# Patient Record
Sex: Male | Born: 1935 | Race: White | Hispanic: No | Marital: Married | Smoking: Former smoker
Health system: Southern US, Community
[De-identification: ages and names within clinical notes are randomized; demographics above are authoritative.]

## PROBLEM LIST (undated history)

## (undated) DIAGNOSIS — E785 Hyperlipidemia, unspecified: Secondary | ICD-10-CM

## (undated) DIAGNOSIS — E119 Type 2 diabetes mellitus without complications: Secondary | ICD-10-CM

## (undated) DIAGNOSIS — E1129 Type 2 diabetes mellitus with other diabetic kidney complication: Secondary | ICD-10-CM

## (undated) DIAGNOSIS — K219 Gastro-esophageal reflux disease without esophagitis: Secondary | ICD-10-CM

## (undated) DIAGNOSIS — I10 Essential (primary) hypertension: Secondary | ICD-10-CM

## (undated) DIAGNOSIS — E559 Vitamin D deficiency, unspecified: Secondary | ICD-10-CM

## (undated) HISTORY — DX: Type 2 diabetes mellitus with other diabetic kidney complication: E11.29

## (undated) HISTORY — DX: Gastro-esophageal reflux disease without esophagitis: K21.9

## (undated) HISTORY — PX: CARDIAC CATHETERIZATION: SHX172

## (undated) HISTORY — DX: Type 2 diabetes mellitus without complications: E11.9

## (undated) HISTORY — DX: Hyperlipidemia, unspecified: E78.5

## (undated) HISTORY — DX: Essential (primary) hypertension: I10

## (undated) HISTORY — DX: Vitamin D deficiency, unspecified: E55.9

---
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
## 2001-08-28 HISTORY — PX: CORONARY ARTERY BYPASS GRAFT: SHX141

## 2002-04-02 ENCOUNTER — Inpatient Hospital Stay (HOSPITAL_COMMUNITY): Admission: AD | Admit: 2002-04-02 | Discharge: 2002-04-08 | Payer: Self-pay | Admitting: Cardiology

## 2002-04-03 ENCOUNTER — Encounter: Payer: Self-pay | Admitting: Thoracic Surgery (Cardiothoracic Vascular Surgery)

## 2002-04-04 ENCOUNTER — Encounter: Payer: Self-pay | Admitting: Thoracic Surgery (Cardiothoracic Vascular Surgery)

## 2002-04-05 ENCOUNTER — Encounter: Payer: Self-pay | Admitting: Thoracic Surgery (Cardiothoracic Vascular Surgery)

## 2002-04-06 ENCOUNTER — Encounter: Payer: Self-pay | Admitting: Thoracic Surgery (Cardiothoracic Vascular Surgery)

## 2002-05-05 ENCOUNTER — Encounter (HOSPITAL_COMMUNITY): Admission: RE | Admit: 2002-05-05 | Discharge: 2002-08-03 | Payer: Self-pay | Admitting: Cardiology

## 2003-11-04 ENCOUNTER — Encounter: Admission: RE | Admit: 2003-11-04 | Discharge: 2003-11-04 | Payer: Self-pay | Admitting: Internal Medicine

## 2003-12-01 ENCOUNTER — Encounter: Admission: RE | Admit: 2003-12-01 | Discharge: 2003-12-01 | Payer: Self-pay | Admitting: Internal Medicine

## 2004-12-03 IMAGING — CT CT ABDOMEN WO/W CM
1 series · 15 of 32 positions shown, 19 images · IV contrast (gastro)
Comparison: none

CLINICAL DATA: Left renal contour bulge on abdominal ultrasound for further assessment.  Post appendectomy.  Multiple drug allergies.  Diabetes.  Post CABG.  Contrast code:   V14.8, 429.9.
 ABDOMINAL CT, PRE AND POST CONTRAST ? 12/01/03 
 Following oral contrast and pre and post IV injection of 125 cc Omnipaque 300, Multidetector spiral axial images were obtained through the abdomen and comparison made with abdominal ultrasound of 11/04/03.  Finding of left renal contour bulge at the mid pole corresponds to anatomic variant, so-called ?dromedary hump?.  No focal renal cystic nor solid mass is seen in this region.  Again seen is slight diffuse fatty infiltration of the liver.  Moderate atheromatous vascular calcification of the abdominal aorta is seen with maximal dimension measuring 2.7 cm (image 81).  Base of lungs are clear.  Incidental bilateral mid right and 6 mm lower pole left cortical renal cystic foci are seen.  Remaining abdominal organs are unremarkable with no inflammation or adenopathy. 
 IMPRESSION
 Since abdominal ultrasound of 11/04/03:
 1.  Left renal contour bulge corresponds to anatomic variant dromedary hump/fetal lobulation.
 2.  Stable slight diffuse fatty infiltration of liver.
 3.  Moderate atheromatous vascular calcification of abdominal aorta and iliac branches. 
 4.  Otherwise no significant abnormality.

[Series 2: routine abdomen · axial · 0.86mm/px · z∈[-294,-24]mm · 15 of 135 slices shown, 19 images]
[im 9/135  soft-tissue]
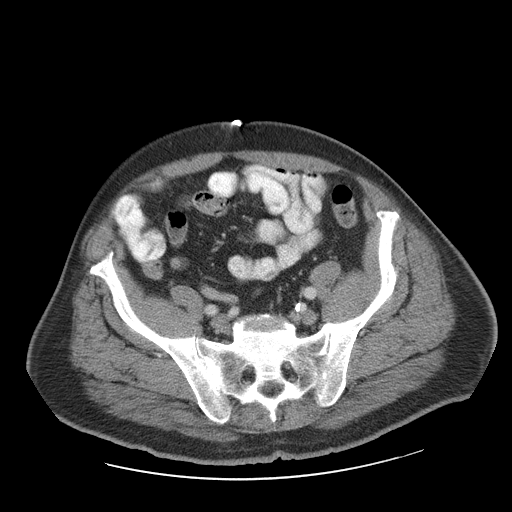
[im 9/135  bone]
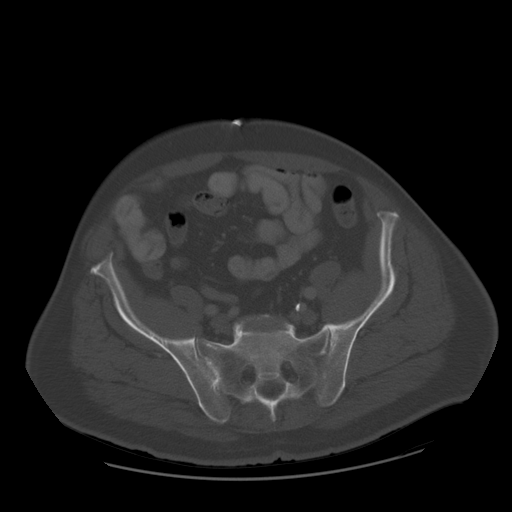
[im 18/135  soft-tissue]
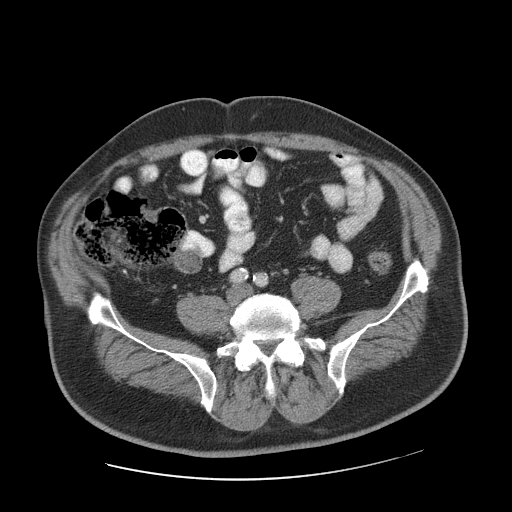
[im 26/135  soft-tissue]
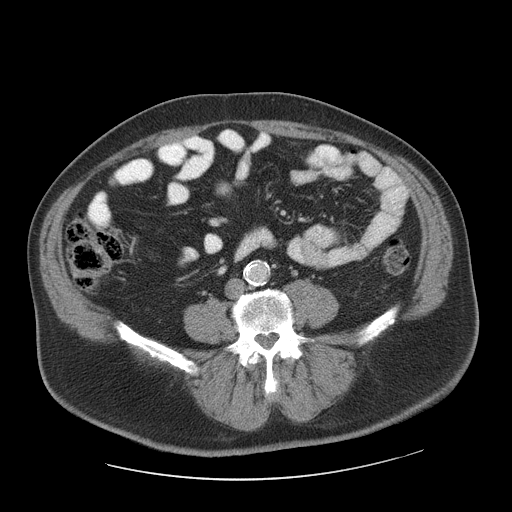
[im 39/135  soft-tissue]
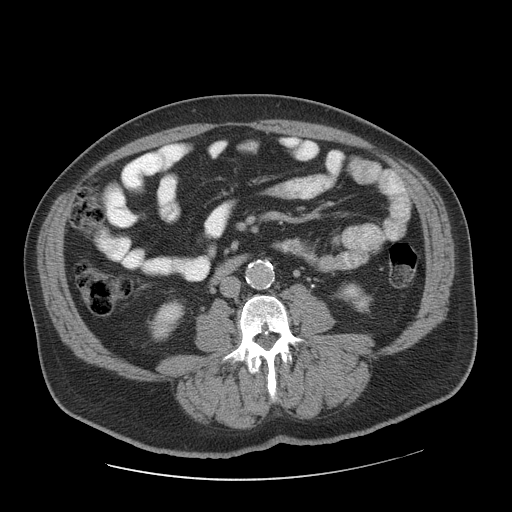
[im 48/135  soft-tissue]
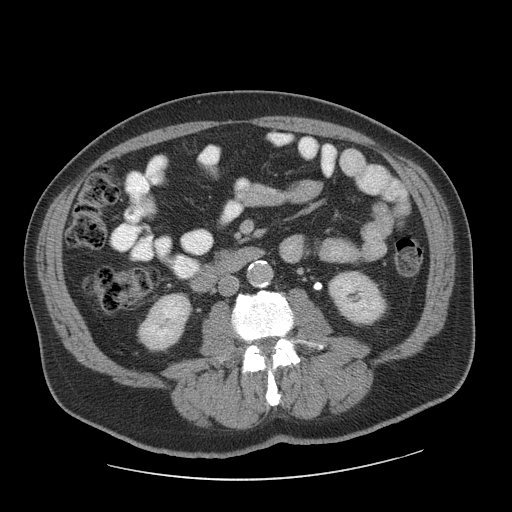
[im 57/135  soft-tissue]
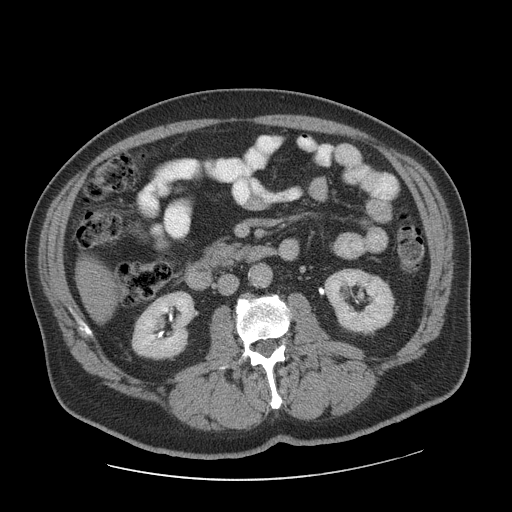
[im 70/135  soft-tissue]
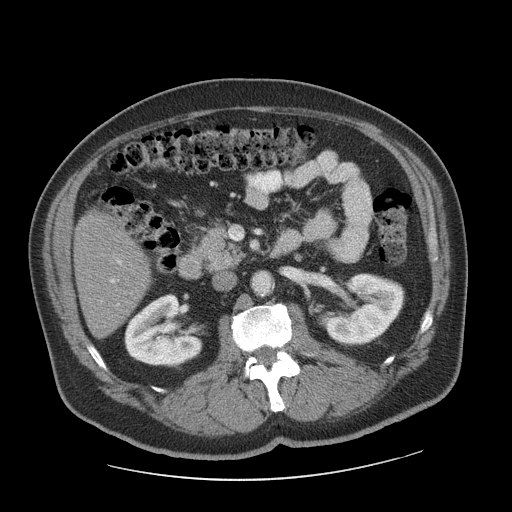
[im 78/135  soft-tissue]
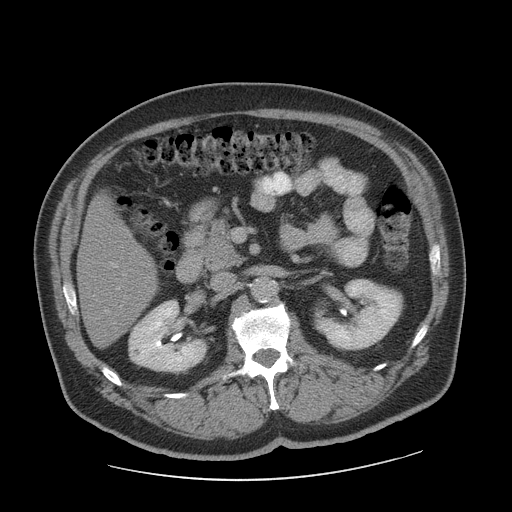
[im 87/135  soft-tissue]
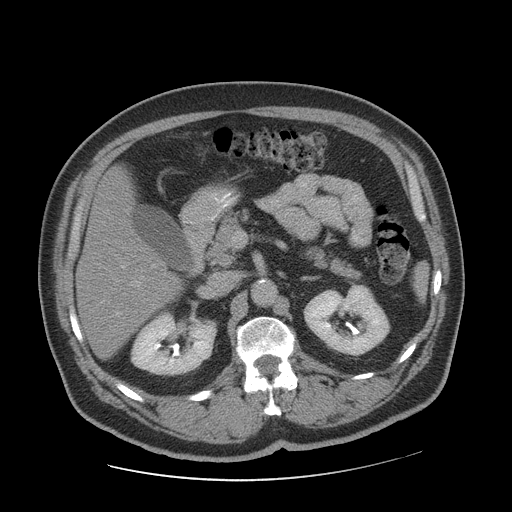
[im 87/135  bone]
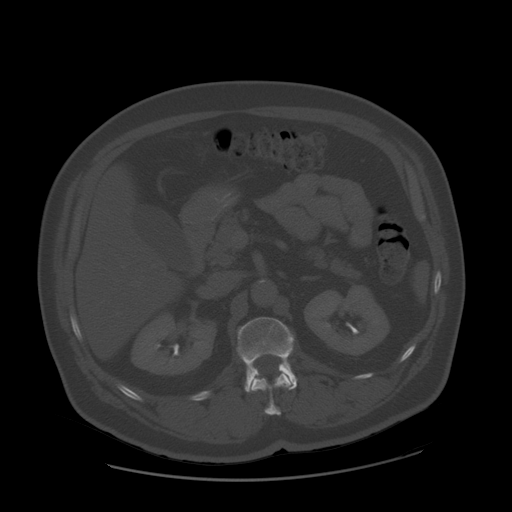
[im 96/135  soft-tissue]
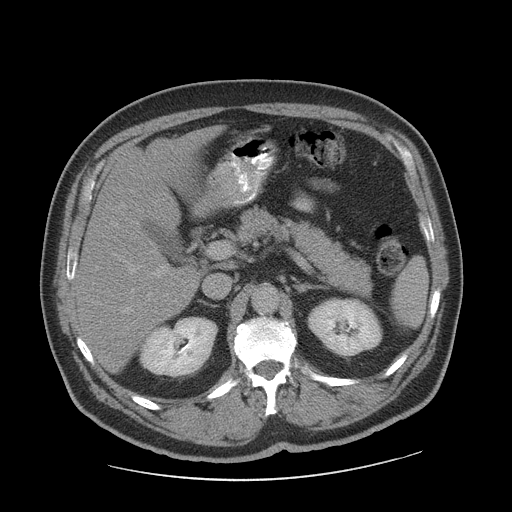
[im 109/135  soft-tissue]
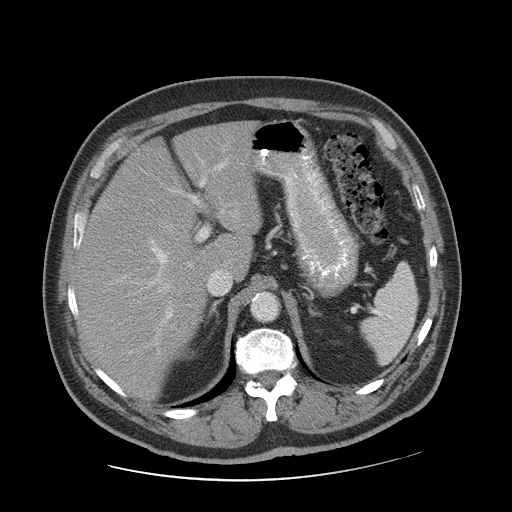
[im 117/135  soft-tissue]
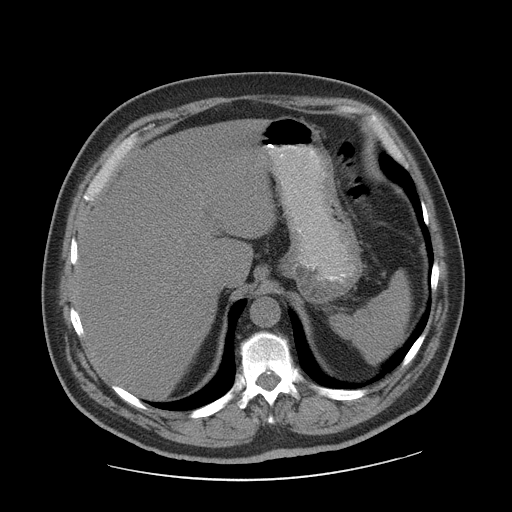
[im 117/135  lung]
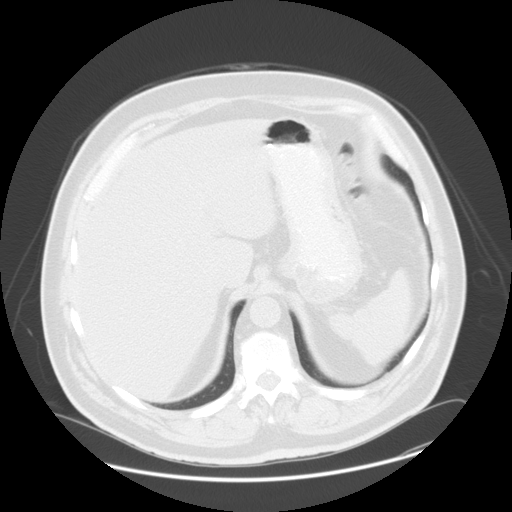
[im 122/135  lung]
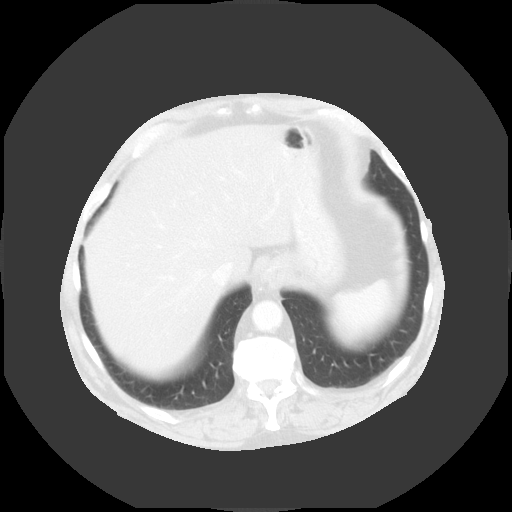
[im 126/135  soft-tissue]
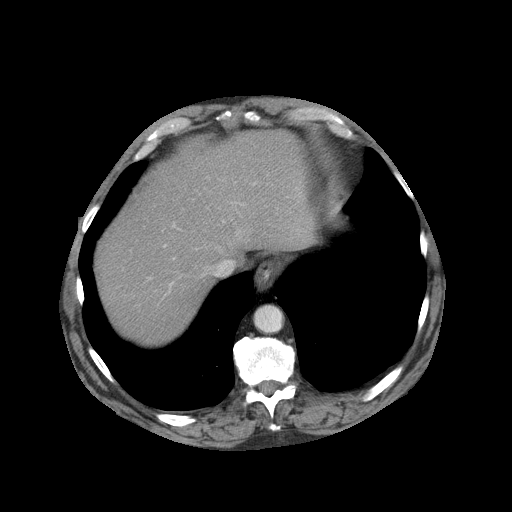
[im 126/135  lung]
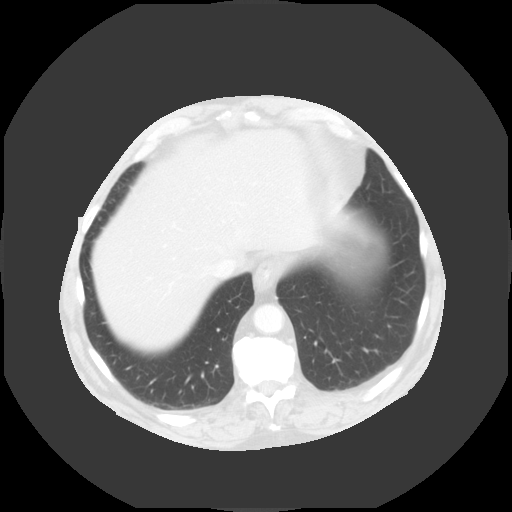
[im 130/135  lung]
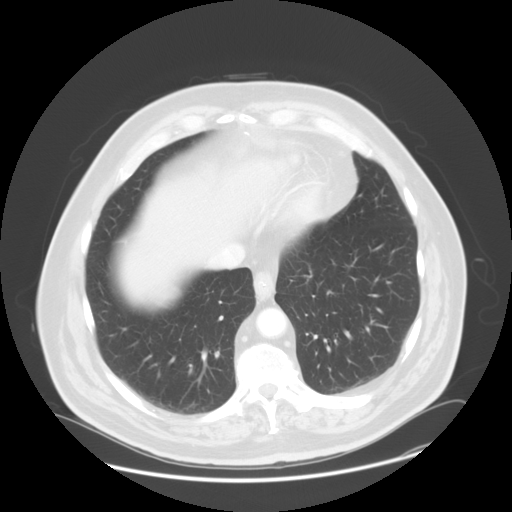

[15 of 32 positions shown; findings below may reference images not displayed]

## 2005-08-24 ENCOUNTER — Encounter: Admission: RE | Admit: 2005-08-24 | Discharge: 2005-08-27 | Payer: Self-pay | Admitting: Internal Medicine

## 2005-09-11 ENCOUNTER — Encounter: Admission: RE | Admit: 2005-09-11 | Discharge: 2005-12-10 | Payer: Self-pay | Admitting: Internal Medicine

## 2005-09-25 ENCOUNTER — Ambulatory Visit: Payer: Self-pay | Admitting: Gastroenterology

## 2005-10-19 ENCOUNTER — Ambulatory Visit: Payer: Self-pay | Admitting: Gastroenterology

## 2005-11-13 ENCOUNTER — Ambulatory Visit: Payer: Self-pay | Admitting: Gastroenterology

## 2006-12-27 HISTORY — PX: EYE SURGERY: SHX253

## 2009-08-30 ENCOUNTER — Telehealth (INDEPENDENT_AMBULATORY_CARE_PROVIDER_SITE_OTHER): Payer: Self-pay | Admitting: *Deleted

## 2009-09-14 ENCOUNTER — Telehealth (INDEPENDENT_AMBULATORY_CARE_PROVIDER_SITE_OTHER): Payer: Self-pay | Admitting: *Deleted

## 2010-09-29 NOTE — Progress Notes (Signed)
  Phone Note From Other Clinic   Caller: Jodie Details for Reason: Pt.Information Initial call taken by: Denny Peon    Faxed over all Cardiac to [facility] fax [phone] [facility]  September 14, 2009 9:35 AM

## 2010-09-29 NOTE — Progress Notes (Signed)
  Phone Note Other Incoming   Caller: Jodie  Reason for Call: Get patient information Action Taken: Information Sent Initial call taken by: Denny Peon    Faxed All REent Cardiac over to [facility] to fax [phone] [facility]  August 30, 2009 9:45 AM

## 2012-09-17 ENCOUNTER — Other Ambulatory Visit (HOSPITAL_COMMUNITY): Payer: Self-pay | Admitting: Internal Medicine

## 2012-09-17 DIAGNOSIS — I714 Abdominal aortic aneurysm, without rupture: Secondary | ICD-10-CM

## 2012-09-18 ENCOUNTER — Ambulatory Visit (HOSPITAL_COMMUNITY): Admission: RE | Admit: 2012-09-18 | Payer: Medicare Other | Source: Ambulatory Visit

## 2012-09-27 ENCOUNTER — Ambulatory Visit (HOSPITAL_COMMUNITY)
Admission: RE | Admit: 2012-09-27 | Discharge: 2012-09-27 | Disposition: A | Discharge status: Home / Self Care | Payer: Medicare Other | Source: Ambulatory Visit | Attending: Internal Medicine | Admitting: Internal Medicine

## 2012-09-27 DIAGNOSIS — N200 Calculus of kidney: Secondary | ICD-10-CM | POA: Insufficient documentation

## 2012-09-27 DIAGNOSIS — I714 Abdominal aortic aneurysm, without rupture, unspecified: Secondary | ICD-10-CM | POA: Insufficient documentation

## 2013-07-09 ENCOUNTER — Encounter: Payer: Self-pay | Admitting: Internal Medicine

## 2013-07-09 DIAGNOSIS — E1122 Type 2 diabetes mellitus with diabetic chronic kidney disease: Secondary | ICD-10-CM | POA: Insufficient documentation

## 2013-07-09 DIAGNOSIS — E785 Hyperlipidemia, unspecified: Secondary | ICD-10-CM | POA: Insufficient documentation

## 2013-07-09 DIAGNOSIS — I10 Essential (primary) hypertension: Secondary | ICD-10-CM | POA: Insufficient documentation

## 2013-07-09 DIAGNOSIS — E559 Vitamin D deficiency, unspecified: Secondary | ICD-10-CM | POA: Insufficient documentation

## 2013-07-09 DIAGNOSIS — K219 Gastro-esophageal reflux disease without esophagitis: Secondary | ICD-10-CM | POA: Insufficient documentation

## 2013-07-10 ENCOUNTER — Ambulatory Visit: Payer: Self-pay | Admitting: Physician Assistant

## 2013-07-11 ENCOUNTER — Ambulatory Visit: Payer: Medicare Other | Admitting: Physician Assistant

## 2013-07-11 ENCOUNTER — Encounter: Payer: Self-pay | Admitting: Physician Assistant

## 2013-07-11 VITALS — BP 136/60 | HR 64 | Temp 97.7°F | Resp 16 | Ht 70.0 in | Wt 298.0 lb

## 2013-07-11 DIAGNOSIS — E119 Type 2 diabetes mellitus without complications: Secondary | ICD-10-CM

## 2013-07-11 DIAGNOSIS — I10 Essential (primary) hypertension: Secondary | ICD-10-CM

## 2013-07-11 DIAGNOSIS — E785 Hyperlipidemia, unspecified: Secondary | ICD-10-CM

## 2013-07-11 DIAGNOSIS — E559 Vitamin D deficiency, unspecified: Secondary | ICD-10-CM

## 2013-07-11 DIAGNOSIS — Z79899 Other long term (current) drug therapy: Secondary | ICD-10-CM

## 2013-07-11 LAB — LIPID PANEL
LDL Cholesterol: 73 mg/dL (ref 0–99)
Total CHOL/HDL Ratio: 3.4 Ratio
VLDL: 24 mg/dL (ref 0–40)

## 2013-07-11 LAB — CBC WITH DIFFERENTIAL/PLATELET
Eosinophils Relative: 8 % — ABNORMAL HIGH (ref 0–5)
HCT: 35 % — ABNORMAL LOW (ref 39.0–52.0)
Lymphocytes Relative: 27 % (ref 12–46)
Lymphs Abs: 2.2 10*3/uL (ref 0.7–4.0)
MCV: 96.2 fL (ref 78.0–100.0)
Platelets: 248 10*3/uL (ref 150–400)
RBC: 3.64 MIL/uL — ABNORMAL LOW (ref 4.22–5.81)
WBC: 8.1 10*3/uL (ref 4.0–10.5)

## 2013-07-11 LAB — BASIC METABOLIC PANEL WITH GFR
CO2: 24 mEq/L (ref 19–32)
Chloride: 102 mEq/L (ref 96–112)
Creat: 1.24 mg/dL (ref 0.50–1.35)
Potassium: 5.1 mEq/L (ref 3.5–5.3)

## 2013-07-11 LAB — HEPATIC FUNCTION PANEL
AST: 26 U/L (ref 0–37)
Albumin: 3.9 g/dL (ref 3.5–5.2)
Alkaline Phosphatase: 31 U/L — ABNORMAL LOW (ref 39–117)
Bilirubin, Direct: 0.1 mg/dL (ref 0.0–0.3)
Indirect Bilirubin: 0.3 mg/dL (ref 0.0–0.9)
Total Bilirubin: 0.4 mg/dL (ref 0.3–1.2)

## 2013-07-11 LAB — HEMOGLOBIN A1C: Hgb A1c MFr Bld: 8.4 % — ABNORMAL HIGH (ref <5.7)

## 2013-07-11 NOTE — Patient Instructions (Signed)

## 2013-07-11 NOTE — Progress Notes (Signed)
HPI Patient presents for 3 month follow up with hypertension, hyperlipidemia, prediabetes and vitamin D.  Patient's blood pressure has been controlled at home. Patient denies chest pain, shortness of breath, dizziness.  Patient's cholesterol is diet controlled and is on lipitor and denies myalgias. The cholesterol last visit was LDL 84 . he has been working on diet and exercise for Diabetes, as well as on glipizide and metformin, denies changes in vision, polys, and paresthesias. Denies hypoglycemic episodes. Last visit his A1C was 8.4 (7.5). He states that his sugar usually runs 130-180. 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  Current Medications:       aspirin 81 MG tablet  Take 81 mg by mouth daily.     atenolol 100 MG tablet  Commonly known as:  TENORMIN  Take 100 mg by mouth daily. 1/2 daily     atorvastatin 80 MG tablet  Commonly known as:  LIPITOR  Take 80 mg by mouth daily. 1/2 tablet daily     enalapril 20 MG tablet  Commonly known as:  VASOTEC  Take 20 mg by mouth daily.     Fish Oil 1000 MG Caps  Take by mouth daily.     glipiZIDE 5 MG tablet  Commonly known as:  GLUCOTROL  Take by mouth 3 (three) times daily.     LORazepam 2 MG tablet  Commonly known as:  ATIVAN  Take by mouth. 1/2 tablet at bedtime     Magnesium 500 MG Caps  Take 500 mg by mouth daily.     metFORMIN 500 MG 24 hr tablet  Commonly known as:  GLUCOPHAGE-XR  Take 500 mg by mouth 2 (two) times daily at 10 AM and 5 PM.     omeprazole 20 MG capsule  Commonly known as:  PRILOSEC  Take 20 mg by mouth daily.     VITAMIN D PO  Take 8,000 Int'l Units by mouth daily.       Medical History:  Past Medical History  Diagnosis Date  . Diabetes mellitus without complication   . Hyperlipidemia   . Hypertension   . Vitamin D deficiency   . GERD (gastroesophageal reflux disease)   . Type II or unspecified type diabetes mellitus with renal manifestations, not stated as  uncontrolled(250.40)    Allergies:  Allergies  Allergen Reactions  . Actos [Pioglitazone]   . Onglyza [Saxagliptin]   . Percocet [Oxycodone-Acetaminophen]     hallucinations    ROS Constitutional: Denies fever, chills, weight loss/gain, headaches, insomnia, fatigue, night sweats, and change in appetite. Eyes: Denies redness, blurred vision, diplopia, discharge, itchy, watery eyes.  ENT: Denies discharge, congestion, post nasal drip, sore throat, earache, dental pain, Tinnitus, Vertigo, Sinus pain, snoring.  Cardio: Denies chest pain, palpitations, irregular heartbeat,  dyspnea, diaphoresis, orthopnea, PND, claudication, edema Respiratory: denies cough, dyspnea,pleurisy, hoarseness, wheezing.  Gastrointestinal: Denies dysphagia, heartburn,  water brash, pain, cramps, nausea, vomiting, bloating, diarrhea, constipation, hematemesis, melena, hematochezia,  hemorrhoids Genitourinary: Denies dysuria, frequency, urgency, nocturia, hesitancy, discharge, hematuria, flank pain Musculoskeletal: Denies arthralgia, myalgia, stiffness, Jt. Swelling, pain, limp, and strain/sprain. Skin: Denies puritis, rash, hives, warts, acne, eczema, changing in skin lesion Neuro: Weakness, tremor, incoordination, spasms, paresthesia, pain Psychiatric: Denies confusion, memory loss, sensory loss Endocrine: Denies change in weight, skin, hair change, nocturia, and paresthesia, Diabetic Polys, visual blurring, hyper /hypo glycemic episodes.  Heme/Lymph: Excessive bleeding, bruising, enlarged lymph nodes  Family history- Review and unchanged Social history- Review and unchanged Physical Exam:  Filed Vitals:   07/11/13 0942  BP: 136/60  Pulse: 64  Temp: 97.7 F (36.5 C)  Resp: 16   Filed Weights   07/11/13 0942  Weight: 298 lb (135.172 kg)   General Appearance: Well nourished, in no apparent distress. Eyes: PERRLA, EOMs, conjunctiva no swelling or erythema, normal fundi and vessels. Sinuses: No  Frontal/maxillary tenderness ENT/Mouth: Ext aud canals clear, with TMs without erythema, bulging.No erythema, swelling, or exudate on post pharynx.  Tonsils not swollen or erythematous. Hearing normal.  Neck: Supple, thyroid normal.  Respiratory: Respiratory effort normal, BS equal bilaterally without rales, rhonci, wheezing or stridor.  Cardio: + systolic 4/6 murmur likely aortic stenosis and some mitral regurg rubs or gallops. Peripheral pulses brisk and equal bilaterally, without edema.  Abdomen: Flat, soft, with bowel sounds. Nontender, no guarding, rebound, hernias, masses, or organomegaly.  Lymphatics: Non tender without lymphadenopathy.  Musculoskeletal: Full ROM all peripheral extremities, joint stability, 5/5 strength, and normal gait. Skin: Warm, dry without rashes, lesions, ecchymosis.  Neuro: Cranial nerves intact, reflexes equal bilaterally. Normal muscle tone, no cerebellar symptoms. Sensation intact.  Pysch: Awake and oriented X 3, normal affect, Insight and Judgment appropriate.   Assessement and Plan:  Hypertension: Continue medication, monitor blood pressure at home. Continue DASH diet. Cholesterol: Continue diet and exercise. Check cholesterol.  Diabetes with renal manifestations-Long discussion about diet and exercise. Check A1C.  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   Quentin Mulling 11:14 AM

## 2013-07-12 LAB — TSH: TSH: 2.423 u[IU]/mL (ref 0.350–4.500)

## 2013-08-26 ENCOUNTER — Other Ambulatory Visit: Payer: Self-pay | Admitting: Internal Medicine

## 2013-08-26 ENCOUNTER — Telehealth: Payer: Self-pay | Admitting: Internal Medicine

## 2013-08-26 MED ORDER — BENZONATATE 100 MG PO CAPS: 100.0000 mg | ORAL_CAPSULE | Freq: Three times a day (TID) | ORAL | Status: DC | PRN

## 2013-08-26 MED ORDER — PREDNISONE (PAK) 10 MG PO TABS: ORAL_TABLET | Freq: Every day | ORAL | Status: DC

## 2013-08-26 NOTE — Telephone Encounter (Signed)
Patient called the front and left message stating he went to Urgent Care Friday 08/22/13 and was given a Zpak to treat cold symptoms.  Patient states he has been taking too much DayQuil and NyQuil to help treat symptoms of cold and cough and thinks he has irritated his throat.  Per Loree Fee, PA-C, advised patient we would send Tessalon Perles 100mg  1 po TID #42 and Pred DP 10 mg #21to help with symptoms and continue Zpak as directed.  Advised patient to follow up in office for further evaluation if no relief with symptoms with the Rxs.  Sent to [facility] pharmacy.

## 2013-09-02 ENCOUNTER — Ambulatory Visit (INDEPENDENT_AMBULATORY_CARE_PROVIDER_SITE_OTHER): Payer: Medicare Other | Admitting: Emergency Medicine

## 2013-09-02 ENCOUNTER — Encounter: Payer: Self-pay | Admitting: Emergency Medicine

## 2013-09-02 VITALS — BP 126/80 | HR 88 | Temp 98.0°F | Resp 18 | Ht 70.0 in | Wt 190.0 lb

## 2013-09-02 DIAGNOSIS — R634 Abnormal weight loss: Secondary | ICD-10-CM

## 2013-09-02 DIAGNOSIS — R059 Cough, unspecified: Secondary | ICD-10-CM

## 2013-09-02 DIAGNOSIS — R05 Cough: Secondary | ICD-10-CM

## 2013-09-02 DIAGNOSIS — D649 Anemia, unspecified: Secondary | ICD-10-CM

## 2013-09-02 DIAGNOSIS — R5381 Other malaise: Secondary | ICD-10-CM

## 2013-09-02 DIAGNOSIS — R0602 Shortness of breath: Secondary | ICD-10-CM

## 2013-09-02 DIAGNOSIS — R5383 Other fatigue: Principal | ICD-10-CM

## 2013-09-02 DIAGNOSIS — B370 Candidal stomatitis: Secondary | ICD-10-CM

## 2013-09-02 LAB — CBC WITH DIFFERENTIAL/PLATELET
Basophils Absolute: 0 10*3/uL (ref 0.0–0.1)
Basophils Relative: 0 % (ref 0–1)
EOS ABS: 0 10*3/uL (ref 0.0–0.7)
Eosinophils Relative: 0 % (ref 0–5)
HCT: 40.8 % (ref 39.0–52.0)
Hemoglobin: 13.7 g/dL (ref 13.0–17.0)
LYMPHS ABS: 1.4 10*3/uL (ref 0.7–4.0)
Lymphocytes Relative: 9 % — ABNORMAL LOW (ref 12–46)
MCH: 32.2 pg (ref 26.0–34.0)
MCHC: 33.6 g/dL (ref 30.0–36.0)
MCV: 95.8 fL (ref 78.0–100.0)
Monocytes Absolute: 0.3 10*3/uL (ref 0.1–1.0)
Monocytes Relative: 2 % — ABNORMAL LOW (ref 3–12)
NEUTROS ABS: 13.9 10*3/uL — AB (ref 1.7–7.7)
NEUTROS PCT: 89 % — AB (ref 43–77)
PLATELETS: 457 10*3/uL — AB (ref 150–400)
RBC: 4.26 MIL/uL (ref 4.22–5.81)
RDW: 13.3 % (ref 11.5–15.5)
WBC: 15.6 10*3/uL — AB (ref 4.0–10.5)

## 2013-09-02 MED ORDER — DOXYCYCLINE HYCLATE 100 MG PO TABS: 100.0000 mg | ORAL_TABLET | Freq: Two times a day (BID) | ORAL | Status: DC

## 2013-09-02 MED ORDER — NYSTATIN 100000 UNIT/ML MT SUSP: 5.0000 mL | Freq: Four times a day (QID) | OROMUCOSAL | Status: DC

## 2013-09-02 NOTE — Patient Instructions (Signed)
You may have Heart Failure we are checking for it. Heart failure means your heart has trouble pumping blood. This makes it hard for your body to work well. Heart failure is usually a long-term (chronic) condition. You must take good care of yourself and follow your doctor's treatment plan. HOME CARE  Take your heart medicine as told by your doctor.  Do not stop taking medicine unless your doctor tells you to.  Do not skip any dose of medicine.  Refill your medicines before they run out.  Take other medicines only as told by your doctor or pharmacist.  Stay active if told by your doctor. The elderly and people with severe heart failure should talk with a doctor about physical activity.  Eat heart healthy foods. Choose foods that are without trans fat and are low in saturated fat, cholesterol, and salt (sodium). This includes fresh or frozen fruits and vegetables, fish, lean meats, fat-free or low-fat dairy foods, whole grains, and high-fiber foods. Lentils and dried peas and beans (legumes) are also good choices.  Limit salt if told by your doctor.  Cook in a healthy way. Roast, grill, broil, bake, poach, steam, or stir-fry foods.  Limit fluids as told by your doctor.  Weigh yourself every morning. Do this after you pee (urinate) and before you eat breakfast. Write down your weight to give to your doctor.  Take your blood pressure and write it down if your doctor tell you to.  Ask your doctor how to check your pulse. Check your pulse as told.  Lose weight if told by your doctor.  Stop smoking or chewing tobacco. Do not use gum or patches that help you quit without your doctor's approval.  Schedule and go to doctor visits as told.  Nonpregnant women should have no more than 1 drink a day. Men should have no more than 2 drinks a day. Talk to your doctor about drinking alcohol.  Stop illegal drug use.  Stay current with shots (immunizations).  Manage your health conditions as  told by your doctor.  Learn to manage your stress.  Rest when you are tired.  If it is really hot outside:  Avoid intense activities.  Use air conditioning or fans, or get in a cooler place.  Avoid caffeine and alcohol.  Wear loose-fitting, lightweight, and light-colored clothing.  If it is really cold outside:  Avoid intense activities.  Layer your clothing.  Wear mittens or gloves, a hat, and a scarf when going outside.  Avoid alcohol.  Learn about heart failure and get support as needed.  Get help to maintain or improve your quality of life and your ability to care for yourself as needed. GET HELP IF:   You gain 03 lb/1.4 kg or more in 1 day or 05 lb/2.3 kg in a week.  You are more short of breath than usual.  You cannot do your normal activities.  You tire easily.  You cough more than normal, especially with activity.  You have any or more puffiness (swelling) in areas such as your hands, feet, ankles, or belly (abdomen).  You cannot sleep because it is hard to breathe.  You feel like your heart is beating fast (palpitations).  You get dizzy or lightheaded when you stand up. GET HELP RIGHT AWAY IF:   You have trouble breathing.  There is a change in mental status, such as becoming less alert or not being able to focus.  You have chest pain or discomfort.  You  faint. MAKE SURE YOU:   Understand these instructions.  Will watch your condition.  Will get help right away if you are not doing well or get worse. Document Released: 05/23/2008 Document Revised: 12/09/2012 Document Reviewed: 03/14/2012 [facility] Patient Information 2014 [place], [place]. Pneumonia, Adult Pneumonia is an infection of the lungs. It may be caused by a germ (virus or bacteria). Some types of pneumonia can spread easily from person to person. This can happen when you cough or sneeze. HOME CARE  Only take medicine as told by your doctor.  Take your medicine (antibiotics) as  told. Finish it even if you start to feel better.  Do not smoke.  You may use a vaporizer or humidifier in your room. This can help loosen thick spit (mucus).  Sleep so you are almost sitting up (semi-upright). This helps reduce coughing.  Rest. A shot (vaccine) can help prevent pneumonia. Shots are often advised for:  People over 37 years old.  Patients on chemotherapy.  People with long-term (chronic) lung problems.  People with immune system problems. GET HELP RIGHT AWAY IF:   You are getting worse.  You cannot control your cough, and you are losing sleep.  You cough up blood.  Your pain gets worse, even with medicine.  You have a fever.  Any of your problems are getting worse, not better.  You have shortness of breath or chest pain. MAKE SURE YOU:   Understand these instructions.  Will watch your condition.  Will get help right away if you are not doing well or get worse. Document Released: 01/31/2008 Document Revised: 11/06/2011 Document Reviewed: 11/04/2010 [facility] Patient Information 2014 [place].

## 2013-09-02 NOTE — Progress Notes (Signed)
Subjective:    Patient ID: Ricky Morales, male    DOB: 01-20-1935, 79 y.o.   MRN: [id number]  HPI Comments: 79 yo male recentlyTreated with Mucinex/ Zpak/ tessalon perles and mild relief with cough. He notes Still with congestion and mildly unsteady with change of position. + increase in fatigue/ leg weakness. Mild SOB with exertion and he notes concern with history of heart murmur. He notes he eats healthy but has noticed decreased appetite and has lost 14 # in last 3 months with out trying.   Cough Associated symptoms include ear pain and shortness of breath.  Weakness Associated symptoms include congestion, coughing, fatigue and weakness.  Shortness of Breath Associated symptoms include ear pain.  Otalgia  Associated symptoms include coughing.    Current Outpatient Prescriptions on File Prior to Visit  Medication Sig Dispense Refill  . aspirin 81 MG tablet Take 81 mg by mouth daily.      Marland Kitchen atenolol (TENORMIN) 100 MG tablet Take 100 mg by mouth daily. 1/2 daily      . atorvastatin (LIPITOR) 80 MG tablet Take 80 mg by mouth daily. 1/2 tablet daily      . benzonatate (TESSALON PERLES) 100 MG capsule Take 1 capsule (100 mg total) by mouth 3 (three) times daily as needed for cough.  42 capsule  0  . Cholecalciferol (VITAMIN D PO) Take 8,000 Int'l Units by mouth daily.      . enalapril (VASOTEC) 20 MG tablet Take 20 mg by mouth daily.      Marland Kitchen glipiZIDE (GLUCOTROL) 5 MG tablet Take by mouth 3 (three) times daily.      Marland Kitchen LORazepam (ATIVAN) 2 MG tablet Take by mouth. 1/2 tablet at bedtime      . Magnesium 500 MG CAPS Take 500 mg by mouth daily.      . metFORMIN (GLUCOPHAGE-XR) 500 MG 24 hr tablet Take 500 mg by mouth 2 (two) times daily at 10 AM and 5 PM.      . Omega-3 Fatty Acids (FISH OIL) 1000 MG CAPS Take by mouth daily.      Marland Kitchen omeprazole (PRILOSEC) 20 MG capsule Take 20 mg by mouth daily.      . predniSONE (STERAPRED UNI-PAK) 10 MG tablet Take by mouth daily.  21 tablet  0   No  current facility-administered medications on file prior to visit.   ALLERGIES Actos; Onglyza; and Percocet  Past Medical History  Diagnosis Date  . Diabetes mellitus without complication   . Hyperlipidemia   . Hypertension   . Vitamin D deficiency   . GERD (gastroesophageal reflux disease)   . Type II or unspecified type diabetes mellitus with renal manifestations, not stated as uncontrolled(250.40)     Review of Systems  Constitutional: Positive for appetite change, fatigue and unexpected weight change.  HENT: Positive for congestion and ear pain.   Respiratory: Positive for cough and shortness of breath.   Musculoskeletal: Positive for gait problem.  Neurological: Positive for weakness.  BP 126/80  Pulse 88  Temp(Src) 98 F (36.7 C) (Temporal)  Resp 18  Ht 5\' 10"  (1.778 m)  Wt 190 lb (86.183 kg)  BMI 27.26 kg/m2      Objective:   Physical Exam  Nursing note and vitals reviewed. Constitutional: He is oriented to person, place, and time. He appears well-developed.  Appears thin and pale  HENT:  Head: Normocephalic and atraumatic.  Right Ear: External ear normal.  Left Ear: External ear normal.  Nose:  Nose normal.  Mouth/Throat: Oropharyngeal exudate present.  White/ TAN plaques on lateral edges of tongue. Bil canals obstructed with dark cerumen  Eyes: Conjunctivae and EOM are normal.  Neck: Normal range of motion. Neck supple. No JVD present. No thyromegaly present.  Cardiovascular: Normal rate, regular rhythm and intact distal pulses.   Murmur heard. 3/6  Pulmonary/Chest: Effort normal and breath sounds normal.  Mild congestion with cough, ? Decreased breath sounds lower lobes vs shallow inhalation  Abdominal: Soft. Bowel sounds are normal. He exhibits no distension and no mass. There is no tenderness. There is no rebound and no guarding.  Musculoskeletal: Normal range of motion. He exhibits no edema and no tenderness.  Lymphadenopathy:    He has no cervical  adenopathy.  Neurological: He is alert and oriented to person, place, and time. He has normal reflexes. No cranial nerve deficit. Coordination abnormal.  Mild difficulty initiating change of position and start of walking  Skin: Skin is warm and dry.  Psychiatric: He has a normal mood and affect. His behavior is normal. Judgment and thought content normal.          Assessment & Plan:  1. WT Loss/ leg weakness/ ? Anemia HX/ Fatigue- check labs, increase activity and H2O, CXR, Ref Cardio for evaluation if SX increase ER. 2. Cough/ SOB-infection vs murmur- check labs/ cxr/ ref cardio. Continue tessalon perles/ Pred AD 3. Oral yeast- Nystatin mouth wash AD, add probiotics/ yogurt/ change tooth brush 4. CERUMEN IMPACTION-  Will try otc debrox at home 1st if no change f/u for lavage

## 2013-09-03 ENCOUNTER — Encounter (HOSPITAL_COMMUNITY): Payer: Self-pay | Admitting: Emergency Medicine

## 2013-09-03 ENCOUNTER — Emergency Department (HOSPITAL_COMMUNITY): Payer: Medicare Other

## 2013-09-03 ENCOUNTER — Emergency Department (HOSPITAL_COMMUNITY)
Admission: EM | Admit: 2013-09-03 | Discharge: 2013-09-03 | Disposition: A | Discharge status: Home / Self Care | Payer: Medicare Other | Attending: Emergency Medicine | Admitting: Emergency Medicine

## 2013-09-03 DIAGNOSIS — R5383 Other fatigue: Secondary | ICD-10-CM

## 2013-09-03 DIAGNOSIS — Z951 Presence of aortocoronary bypass graft: Secondary | ICD-10-CM | POA: Insufficient documentation

## 2013-09-03 DIAGNOSIS — IMO0002 Reserved for concepts with insufficient information to code with codable children: Secondary | ICD-10-CM | POA: Insufficient documentation

## 2013-09-03 DIAGNOSIS — R5381 Other malaise: Secondary | ICD-10-CM | POA: Insufficient documentation

## 2013-09-03 DIAGNOSIS — E785 Hyperlipidemia, unspecified: Secondary | ICD-10-CM | POA: Insufficient documentation

## 2013-09-03 DIAGNOSIS — K219 Gastro-esophageal reflux disease without esophagitis: Secondary | ICD-10-CM | POA: Insufficient documentation

## 2013-09-03 DIAGNOSIS — I10 Essential (primary) hypertension: Secondary | ICD-10-CM | POA: Insufficient documentation

## 2013-09-03 DIAGNOSIS — R739 Hyperglycemia, unspecified: Secondary | ICD-10-CM

## 2013-09-03 DIAGNOSIS — E559 Vitamin D deficiency, unspecified: Secondary | ICD-10-CM | POA: Insufficient documentation

## 2013-09-03 DIAGNOSIS — Z7982 Long term (current) use of aspirin: Secondary | ICD-10-CM | POA: Insufficient documentation

## 2013-09-03 DIAGNOSIS — Z95818 Presence of other cardiac implants and grafts: Secondary | ICD-10-CM | POA: Insufficient documentation

## 2013-09-03 DIAGNOSIS — Z79899 Other long term (current) drug therapy: Secondary | ICD-10-CM | POA: Insufficient documentation

## 2013-09-03 DIAGNOSIS — Z87891 Personal history of nicotine dependence: Secondary | ICD-10-CM | POA: Insufficient documentation

## 2013-09-03 DIAGNOSIS — E1129 Type 2 diabetes mellitus with other diabetic kidney complication: Secondary | ICD-10-CM | POA: Insufficient documentation

## 2013-09-03 LAB — CBC
HCT: 36.6 % — ABNORMAL LOW (ref 39.0–52.0)
HEMOGLOBIN: 13 g/dL (ref 13.0–17.0)
MCH: 31.7 pg (ref 26.0–34.0)
MCHC: 35.5 g/dL (ref 30.0–36.0)
MCV: 89.3 fL (ref 78.0–100.0)
Platelets: 394 10*3/uL (ref 150–400)
RBC: 4.1 MIL/uL — AB (ref 4.22–5.81)
RDW: 12.2 % (ref 11.5–15.5)
WBC: 10.3 10*3/uL (ref 4.0–10.5)

## 2013-09-03 LAB — URINALYSIS, ROUTINE W REFLEX MICROSCOPIC
Bilirubin Urine: NEGATIVE
HGB URINE DIPSTICK: NEGATIVE
Ketones, ur: 15 mg/dL — AB
LEUKOCYTES UA: NEGATIVE
Nitrite: NEGATIVE
PH: 6 (ref 5.0–8.0)
PROTEIN: NEGATIVE mg/dL
SPECIFIC GRAVITY, URINE: 1.031 — AB (ref 1.005–1.030)
Urobilinogen, UA: 0.2 mg/dL (ref 0.0–1.0)

## 2013-09-03 LAB — COMPREHENSIVE METABOLIC PANEL
ALK PHOS: 48 U/L (ref 39–117)
ALT: 50 U/L (ref 0–53)
AST: 23 U/L (ref 0–37)
Albumin: 3.5 g/dL (ref 3.5–5.2)
BILIRUBIN TOTAL: 0.8 mg/dL (ref 0.3–1.2)
BUN: 55 mg/dL — ABNORMAL HIGH (ref 6–23)
CO2: 22 meq/L (ref 19–32)
Calcium: 10 mg/dL (ref 8.4–10.5)
Chloride: 85 mEq/L — ABNORMAL LOW (ref 96–112)
Creatinine, Ser: 1.6 mg/dL — ABNORMAL HIGH (ref 0.50–1.35)
GFR calc Af Amer: 47 mL/min — ABNORMAL LOW (ref >90)
GFR, EST NON AFRICAN AMERICAN: 40 mL/min — AB (ref >90)
GLUCOSE: 629 mg/dL — AB (ref 70–99)
POTASSIUM: 4.7 meq/L (ref 3.7–5.3)
SODIUM: 124 meq/L — AB (ref 137–147)
Total Protein: 8 g/dL (ref 6.0–8.3)

## 2013-09-03 LAB — GLUCOSE, CAPILLARY
GLUCOSE-CAPILLARY: 511 mg/dL — AB (ref 70–99)
GLUCOSE-CAPILLARY: 333 mg/dL — AB (ref 70–99)
Glucose-Capillary: 547 mg/dL — ABNORMAL HIGH (ref 70–99)

## 2013-09-03 LAB — BASIC METABOLIC PANEL WITH GFR
BUN: 59 mg/dL — ABNORMAL HIGH (ref 6–23)
CALCIUM: 9.7 mg/dL (ref 8.4–10.5)
CO2: 21 mEq/L (ref 19–32)
Chloride: 87 mEq/L — ABNORMAL LOW (ref 96–112)
Creat: 1.82 mg/dL — ABNORMAL HIGH (ref 0.50–1.35)
GFR, EST AFRICAN AMERICAN: 41 mL/min — AB
GFR, EST NON AFRICAN AMERICAN: 35 mL/min — AB
Glucose, Bld: 662 mg/dL (ref 70–99)
Potassium: 6.4 mEq/L (ref 3.5–5.3)
SODIUM: 123 meq/L — AB (ref 135–145)

## 2013-09-03 LAB — POCT I-STAT, CHEM 8
BUN: 40 mg/dL — ABNORMAL HIGH (ref 6–23)
Calcium, Ion: 1.24 mmol/L (ref 1.13–1.30)
Chloride: 103 mEq/L (ref 96–112)
Creatinine, Ser: 1.4 mg/dL — ABNORMAL HIGH (ref 0.50–1.35)
GLUCOSE: 294 mg/dL — AB (ref 70–99)
HEMATOCRIT: 35 % — AB (ref 39.0–52.0)
Hemoglobin: 11.9 g/dL — ABNORMAL LOW (ref 13.0–17.0)
POTASSIUM: 4.6 meq/L (ref 3.7–5.3)
Sodium: 135 mEq/L — ABNORMAL LOW (ref 137–147)
TCO2: 23 mmol/L (ref 0–100)

## 2013-09-03 LAB — IRON AND TIBC
%SAT: 28 % (ref 20–55)
Iron: 82 ug/dL (ref 42–165)
TIBC: 290 ug/dL (ref 215–435)
UIBC: 208 ug/dL (ref 125–400)

## 2013-09-03 LAB — TSH: TSH: 0.379 u[IU]/mL (ref 0.350–4.500)

## 2013-09-03 LAB — URINE MICROSCOPIC-ADD ON

## 2013-09-03 LAB — BRAIN NATRIURETIC PEPTIDE: Brain Natriuretic Peptide: 66.2 pg/mL (ref 0.0–100.0)

## 2013-09-03 MED ORDER — SODIUM CHLORIDE 0.9 % IV BOLUS (SEPSIS)
1000.0000 mL | Freq: Once | INTRAVENOUS | Status: AC
  Administered 2013-09-03: 1000 mL via INTRAVENOUS

## 2013-09-03 MED ORDER — INSULIN ASPART 100 UNIT/ML ~~LOC~~ SOLN
10.0000 [IU] | Freq: Once | SUBCUTANEOUS | Status: AC
  Administered 2013-09-03: 10 [IU] via INTRAVENOUS
  Filled 2013-09-03: qty 1

## 2013-09-03 MED ORDER — SODIUM CHLORIDE 0.9 % IV SOLN
INTRAVENOUS | Status: DC
  Filled 2013-09-03: qty 1

## 2013-09-03 MED ORDER — DEXTROSE-NACL 5-0.45 % IV SOLN: INTRAVENOUS | Status: DC

## 2013-09-03 MED ORDER — METFORMIN HCL 500 MG PO TABS
500.0000 mg | ORAL_TABLET | Freq: Two times a day (BID) | ORAL | Status: DC
  Administered 2013-09-03: 500 mg via ORAL
  Filled 2013-09-03: qty 1

## 2013-09-03 MED ORDER — SODIUM CHLORIDE 0.9 % IV SOLN
1000.0000 mL | Freq: Once | INTRAVENOUS | Status: AC
  Administered 2013-09-03: 1000 mL via INTRAVENOUS

## 2013-09-03 MED ORDER — GLIPIZIDE 5 MG PO TABS
5.0000 mg | ORAL_TABLET | Freq: Three times a day (TID) | ORAL | Status: DC
  Administered 2013-09-03: 5 mg via ORAL
  Filled 2013-09-03 (×3): qty 1

## 2013-09-03 MED ORDER — SODIUM CHLORIDE 0.9 % IV SOLN: 1000.0000 mL | INTRAVENOUS | Status: DC

## 2013-09-03 NOTE — ED Notes (Signed)
Pt reports he was seen at PCP yesterday and they called him this am and CBG was over 600 so they told him to come here. Hx of DMII. Pt reports he is getting over the flu and that he is feeling weak but denies N/V and denies pain. A&Ox4, ambulatory to room with slow, steady gait.

## 2013-09-03 NOTE — Discharge Instructions (Signed)

## 2013-09-03 NOTE — ED Notes (Signed)
Patient returned from Xray at 9:53, PT. Was placed back on montior and cbg was taken and reported to Nurse.

## 2013-09-03 NOTE — ED Provider Notes (Signed)
CSN: [id number]     Arrival date & time 09/03/13  0600 History   First MD Initiated Contact with Patient 09/03/13 [phone]     Chief Complaint  Patient presents with  . Hyperglycemia   (Consider location/radiation/quality/duration/timing/severity/associated sxs/prior Treatment) HPI Comments: Patient presents to the emergency department with chief complaint of hyperglycemia. He states that the past couple of weeks, he has been fighting bronchitis in the flu. He states that his PCP gave him a Z-Pak, and that while he has been taking this medicine, he forgot to take his diabetes medication. He states that he had his blood tested yesterday and his primary care office, and they told him that his CBG was over 600, and instructed to come to the emergency department to get fluid. Patient denies any chest pain, shortness of breath, abdominal pain, nausea, or vomiting. He states that he feels a little weak, and a little tired. Otherwise, he states that he is feeling much better than he was (referring to the flu). He has not tried anything to alleviate his current symptoms.  The history is provided by the patient. No language interpreter was used.    Past Medical History  Diagnosis Date  . Diabetes mellitus without complication   . Hyperlipidemia   . Hypertension   . Vitamin D deficiency   . GERD (gastroesophageal reflux disease)   . Type II or unspecified type diabetes mellitus with renal manifestations, not stated as uncontrolled(250.40)    Past Surgical History  Procedure Laterality Date  . Appendectomy  1955  . Coronary artery bypass graft  2003  . Cardiac catheterization  1991, 1992, 2003  . Eye surgery  12/2006    Right CE/IOL   Family History  Problem Relation Age of Onset  . Heart disease Mother   . Diabetes Mother   . Cancer Father    History  Substance Use Topics  . Smoking status: Former Smoker -- 4 years    Types: Cigarettes    Quit date: 08/28/1968  . Smokeless tobacco: Not on  file  . Alcohol Use: Not on file    Review of Systems  All other systems reviewed and are negative.    Allergies  Actos; Onglyza; and Percocet  Home Medications   Current Outpatient Rx  Name  Route  Sig  Dispense  Refill  . acetaminophen (TYLENOL) 500 MG tablet   Oral   Take 500 mg by mouth every 6 (six) hours as needed.         Marland Kitchen aspirin 81 MG tablet   Oral   Take 81 mg by mouth daily.         Marland Kitchen atenolol (TENORMIN) 100 MG tablet   Oral   Take 100 mg by mouth daily. 1/2 daily         . atorvastatin (LIPITOR) 80 MG tablet   Oral   Take 80 mg by mouth daily. 1/2 tablet daily         . benzonatate (TESSALON PERLES) 100 MG capsule   Oral   Take 1 capsule (100 mg total) by mouth 3 (three) times daily as needed for cough.   42 capsule   0   . Cholecalciferol (VITAMIN D PO)   Oral   Take 8,000 Int'l Units by mouth daily.         Marland Kitchen doxycycline (VIBRA-TABS) 100 MG tablet   Oral   Take 1 tablet (100 mg total) by mouth 2 (two) times daily.   20 tablet  0   . enalapril (VASOTEC) 20 MG tablet   Oral   Take 20 mg by mouth daily.         Marland Kitchen glipiZIDE (GLUCOTROL) 5 MG tablet   Oral   Take by mouth 3 (three) times daily.         Marland Kitchen LORazepam (ATIVAN) 2 MG tablet   Oral   Take by mouth. 1/2 tablet at bedtime         . Magnesium 500 MG CAPS   Oral   Take 500 mg by mouth daily.         . metFORMIN (GLUCOPHAGE-XR) 500 MG 24 hr tablet   Oral   Take 500 mg by mouth 2 (two) times daily at 10 AM and 5 PM.         . nystatin (MYCOSTATIN) 100000 UNIT/ML suspension   Oral   Take 5 mLs (500,000 Units total) by mouth 4 (four) times daily.   60 mL   0   . Omega-3 Fatty Acids (FISH OIL) 1000 MG CAPS   Oral   Take by mouth daily.         Marland Kitchen omeprazole (PRILOSEC) 20 MG capsule   Oral   Take 20 mg by mouth daily.         . predniSONE (STERAPRED UNI-PAK) 10 MG tablet   Oral   Take by mouth daily.   21 tablet   0    BP 156/80  Pulse 81   Temp(Src) 97.6 F (36.4 C) (Oral)  Resp 18  Ht 5\' 10"  (1.778 m)  Wt 190 lb (86.183 kg)  BMI 27.26 kg/m2  SpO2 100% Physical Exam  Nursing note and vitals reviewed. Constitutional: He is oriented to person, place, and time. He appears well-developed and well-nourished.  HENT:  Head: Normocephalic and atraumatic.  Right Ear: External ear normal.  Left Ear: External ear normal.  Nose: Nose normal.  Mouth/Throat: Oropharynx is clear and moist. No oropharyngeal exudate.  Eyes: Conjunctivae and EOM are normal. Pupils are equal, round, and reactive to light. Right eye exhibits no discharge. Left eye exhibits no discharge. No scleral icterus.  Neck: Normal range of motion. Neck supple. No JVD present.  Cardiovascular: Normal rate, regular rhythm, normal heart sounds and intact distal pulses.  Exam reveals no gallop and no friction rub.   No murmur heard. Pulmonary/Chest: Effort normal and breath sounds normal. No respiratory distress. He has no wheezes. He has no rales. He exhibits no tenderness.  Abdominal: Soft. He exhibits no distension and no mass. There is no tenderness. There is no rebound and no guarding.  Musculoskeletal: Normal range of motion. He exhibits no edema and no tenderness.  Neurological: He is alert and oriented to person, place, and time.  Skin: Skin is warm and dry.  Psychiatric: He has a normal mood and affect. His behavior is normal. Judgment and thought content normal.    ED Course  Procedures (including critical care time) Results for orders placed during the hospital encounter of 09/03/13  GLUCOSE, CAPILLARY      Result Value Range   Glucose-Capillary 547 (*) 70 - 99 mg/dL   Comment 1 Documented in Chart     Comment 2 Notify RN    CBC      Result Value Range   WBC 10.3  4.0 - 10.5 K/uL   RBC 4.10 (*) 4.22 - 5.81 MIL/uL   Hemoglobin 13.0  13.0 - 17.0 g/dL   HCT 36.6 (*) 39.0 -  52.0 %   MCV 89.3  78.0 - 100.0 fL   MCH 31.7  26.0 - 34.0 pg   MCHC 35.5  30.0  - 36.0 g/dL   RDW 12.2  11.5 - 15.5 %   Platelets 394  150 - 400 K/uL  COMPREHENSIVE METABOLIC PANEL      Result Value Range   Sodium 124 (*) 137 - 147 mEq/L   Potassium 4.7  3.7 - 5.3 mEq/L   Chloride 85 (*) 96 - 112 mEq/L   CO2 22  19 - 32 mEq/L   Glucose, Bld 629 (*) 70 - 99 mg/dL   BUN 55 (*) 6 - 23 mg/dL   Creatinine, Ser 1.60 (*) 0.50 - 1.35 mg/dL   Calcium 10.0  8.4 - 10.5 mg/dL   Total Protein 8.0  6.0 - 8.3 g/dL   Albumin 3.5  3.5 - 5.2 g/dL   AST 23  0 - 37 U/L   ALT 50  0 - 53 U/L   Alkaline Phosphatase 48  39 - 117 U/L   Total Bilirubin 0.8  0.3 - 1.2 mg/dL   GFR calc non Af Amer 40 (*) >90 mL/min   GFR calc Af Amer 47 (*) >90 mL/min  URINALYSIS, ROUTINE W REFLEX MICROSCOPIC      Result Value Range   Color, Urine YELLOW  YELLOW   APPearance CLEAR  CLEAR   Specific Gravity, Urine 1.031 (*) 1.005 - 1.030   pH 6.0  5.0 - 8.0   Glucose, UA >1000 (*) NEGATIVE mg/dL   Hgb urine dipstick NEGATIVE  NEGATIVE   Bilirubin Urine NEGATIVE  NEGATIVE   Ketones, ur 15 (*) NEGATIVE mg/dL   Protein, ur NEGATIVE  NEGATIVE mg/dL   Urobilinogen, UA 0.2  0.0 - 1.0 mg/dL   Nitrite NEGATIVE  NEGATIVE   Leukocytes, UA NEGATIVE  NEGATIVE  GLUCOSE, CAPILLARY      Result Value Range   Glucose-Capillary 511 (*) 70 - 99 mg/dL  URINE MICROSCOPIC-ADD ON      Result Value Range   Squamous Epithelial / LPF RARE  RARE   WBC, UA 0-2  <3 WBC/hpf   RBC / HPF 0-2  <3 RBC/hpf   Bacteria, UA RARE  RARE  GLUCOSE, CAPILLARY      Result Value Range   Glucose-Capillary 333 (*) 70 - 99 mg/dL  POCT I-STAT, CHEM 8      Result Value Range   Sodium 135 (*) 137 - 147 mEq/L   Potassium 4.6  3.7 - 5.3 mEq/L   Chloride 103  96 - 112 mEq/L   BUN 40 (*) 6 - 23 mg/dL   Creatinine, Ser 1.40 (*) 0.50 - 1.35 mg/dL   Glucose, Bld 294 (*) 70 - 99 mg/dL   Calcium, Ion 1.24  1.13 - 1.30 mmol/L   TCO2 23  0 - 100 mmol/L   Hemoglobin 11.9 (*) 13.0 - 17.0 g/dL   HCT 35.0 (*) 39.0 - 52.0 %   Dg Chest 2  View  09/03/2013   CLINICAL DATA:  Several week history of cough  EXAM: CHEST  2 VIEW  COMPARISON:  None.  FINDINGS: The lungs are adequately inflated. There is mild hemidiaphragm flattening bilaterally. The cardiopericardial silhouette is normal in size. The patient has undergone previous CABG. The pulmonary vascularity is not engorged. There is no pleural effusion or pneumothorax. The observed portions of the bony thorax exhibit no acute abnormalities.  IMPRESSION: 1. There is no evidence of CHF nor pneumonia.  2. Mild hyperinflation may reflect underlying COPD.   Electronically Signed   By: David  Martinique   On: 09/03/2013 09:54     EKG Interpretation   None       MDM   1. Hyperglycemia     Patient with hyperglycemia, glucose >600.  Discussed the patient with Dr. Doy Mince, will give fluids, insulin, and recheck glucose.  No complaints.  1:10 PM Glucose is less than 300 now.  Anion gap has resolved.  Will discharge to home.  Patient has been seen by and discussed with Dr. Doy Mince, who agrees with the plan.   Montine Circle, PA-C 09/03/13 1312

## 2013-09-03 NOTE — ED Notes (Signed)
CBG rechecked. [place]

## 2013-09-03 NOTE — ED Notes (Signed)
Pt reports he feels fine, just came in for high bld sugar, sts he has't been taking his metformin since 08/20/13.

## 2013-09-04 NOTE — ED Provider Notes (Signed)
Medical screening examination/treatment/procedure(s) were performed by non-physician practitioner and as supervising physician I was immediately available for consultation/collaboration.  EKG Interpretation    Date/Time:  Wednesday September 03 2013 06:24:50 EST Ventricular Rate:  79 PR Interval:  183 QRS Duration: 88 QT Interval:  373 QTC Calculation: 428 R Axis:   71 Text Interpretation:  Sinus rhythm Nonspecific repol abnormality, diffuse leads Nonspecific ST and T wave abnormality Confirmed by Wyvonnia Dusky  MD, STEPHEN ([id number]) on 09/03/2013 6:55:17 AM              Houston Siren, MD 09/04/13 1424

## 2013-09-08 ENCOUNTER — Ambulatory Visit (INDEPENDENT_AMBULATORY_CARE_PROVIDER_SITE_OTHER): Payer: Medicare Other | Admitting: Internal Medicine

## 2013-09-08 ENCOUNTER — Encounter: Payer: Self-pay | Admitting: Internal Medicine

## 2013-09-08 VITALS — BP 151/83 | HR 64 | Ht 70.0 in | Wt 202.0 lb

## 2013-09-08 DIAGNOSIS — I359 Nonrheumatic aortic valve disorder, unspecified: Secondary | ICD-10-CM

## 2013-09-08 NOTE — Progress Notes (Signed)
HPI Ricky Morales is a 79 yo who has a history of CAD (s/p PTCA of RCA 1991; PTCA of LAD 1992)  He underwent stress test then cath cath in 2003 Showed:  LM 50%. LAD 70% mid; diffuse 50% mid; D1 70%; D2 50%; LCx 40%, OM2 90% RCA 40 to 505; LVEF 55%  He went on to have CABG (LIMA to D2, LAD; SVG to D1; SVG to OM2; SVG to PDA)  The patient has not had cardiology f/u since.  Followed by Dr Melford Aase.  He denies CP( except some muscular type pains) Notes some SOB with heavy exertion only.  Quick recovery.    He was recently treated for URI  Placed on ABX  Stopped taking DM meds.  Went to ER  Hydrated, Glu treated  Sent home.      Allergies  Allergen Reactions  . Actos [Pioglitazone]   . Onglyza [Saxagliptin]   . Percocet [Oxycodone-Acetaminophen]     hallucinations    Current Outpatient Prescriptions  Medication Sig Dispense Refill  . aspirin 81 MG tablet Take 81 mg by mouth daily.      Marland Kitchen atenolol (TENORMIN) 100 MG tablet Take 100 mg by mouth daily.       Marland Kitchen atorvastatin (LIPITOR) 80 MG tablet Take 40 mg by mouth daily.       . Cholecalciferol (VITAMIN D PO) Take 8,000 Int'l Units by mouth daily.      . enalapril (VASOTEC) 20 MG tablet Take 20 mg by mouth daily.      Marland Kitchen glipiZIDE (GLUCOTROL) 5 MG tablet Take 5 mg by mouth 2 (two) times daily before a meal.       . LORazepam (ATIVAN) 2 MG tablet Take 1 mg by mouth at bedtime.       . Magnesium 500 MG CAPS Take 500 mg by mouth daily.      . metFORMIN (GLUCOPHAGE) 500 MG tablet Take by mouth 2 (two) times daily with a meal.      . Omega-3 Fatty Acids (FISH OIL) 1000 MG CAPS Take 1 capsule by mouth daily.       Marland Kitchen omeprazole (PRILOSEC) 20 MG capsule Take 20 mg by mouth daily.      Marland Kitchen acetaminophen (TYLENOL) 500 MG tablet Take 500 mg by mouth every 6 (six) hours as needed.      . benzonatate (TESSALON PERLES) 100 MG capsule Take 1 capsule (100 mg total) by mouth 3 (three) times daily as needed for cough.  42 capsule  0   No current facility-administered  medications for this visit.    Past Medical History  Diagnosis Date  . Diabetes mellitus without complication   . Hyperlipidemia   . Hypertension   . Vitamin D deficiency   . GERD (gastroesophageal reflux disease)   . Type II or unspecified type diabetes mellitus with renal manifestations, not stated as uncontrolled(250.40)     Past Surgical History  Procedure Laterality Date  . Appendectomy  1955  . Coronary artery bypass graft  2003  . Cardiac catheterization  1991, 1992, 2003  . Eye surgery  12/2006    Right CE/IOL    Family History  Problem Relation Age of Onset  . Heart disease Mother   . Diabetes Mother   . Cancer Father     History   Social History  . Marital Status: Married    Spouse Name: N/A    Number of Children: N/A  . Years of Education: N/A   Occupational History  .  Not on file.   Social History Main Topics  . Smoking status: Former Smoker -- 4 years    Types: Cigarettes    Quit date: 08/28/1968  . Smokeless tobacco: Not on file  . Alcohol Use: Not on file  . Drug Use: Not on file  . Sexual Activity: Not on file   Other Topics Concern  . Not on file   Social History Narrative  . No narrative on file    Review of Systems:  All systems reviewed.  They are negative to the above problem except as previously stated.  Vital Signs: BP 151/83  Pulse 64  Ht 5\' 10"  (1.778 m)  Wt 202 lb (91.627 kg)  BMI 28.98 kg/m2  Physical Exam Patient is in NAD HEENT:  Normocephalic, atraumatic. EOMI, PERRLA.  Neck: JVP is normal.  No bruits.  Lungs: clear to auscultation. No rales no wheezes.  Heart: Regular rate and rhythm. Normal S1, S2. No S3.   Gr III/VI systolic murmur LSB to base.  PMI not displaced.  Abdomen:  Supple, nontender. Normal bowel sounds. No masses. No hepatomegaly.  Extremities:   Good distal pulses throughout. No lower extremity edema.  Musculoskeletal :moving all extremities.  Neuro:   alert and oriented x3.  CN II-XII grossly  intact.  EKG  SR 79 bpm  (09/04/13)  Assessment and Plan:  1.  Murmur  Would set up for echo to evaluate AV and LV function.  2.  CAD  Remote CABG Seems to be relatively symptom free.  Will review echo before considering furhter testing  3.  HL  Will get labs from Dr Melford Aase  4.  DM  Follows with Dr. Melford Aase  5.  HTN  Good control  Tentative f/u in Fall or earlier if needed.

## 2013-09-08 NOTE — Patient Instructions (Signed)
The current medical regimen is effective;  continue present plan and medications.  Your physician has requested that you have an echocardiogram. Echocardiography is a painless test that uses sound waves to create images of your heart. It provides your doctor with information about the size and shape of your heart and how well your heart's chambers and valves are working. This procedure takes approximately one hour. There are no restrictions for this procedure.  Follow up will be based on these results. 

## 2013-09-12 ENCOUNTER — Ambulatory Visit: Payer: Medicare Other | Admitting: Internal Medicine

## 2013-09-12 ENCOUNTER — Other Ambulatory Visit: Payer: Self-pay | Admitting: Emergency Medicine

## 2013-09-12 MED ORDER — NITROGLYCERIN 0.4 MG SL SUBL: 0.4000 mg | SUBLINGUAL_TABLET | SUBLINGUAL | Status: DC | PRN

## 2013-09-23 ENCOUNTER — Ambulatory Visit: Payer: Self-pay | Admitting: Internal Medicine

## 2013-09-23 NOTE — Progress Notes (Signed)
Patient ID: Ricky Morales, male   DOB: 03/25/1935, 79 y.o.   MRN: [id number]  Annual Screening Comprehensive Examination  This very nice 79 y.o.  MWM presents for complete physical.  Patient has been followed for HTN, Diabetes  With Nephropathy, Hyperlipidemia, and Vitamin D Deficiency.   HTN predates since     . Patient's BP has been controlled at home.Today's  . He has ASHD and had CABG in 2003. Patient denies any cardiac symptoms as chest pain, palpitations, shortness of breath, dizziness or ankle swelling.   Patient's hyperlipidemia is controlled with diet and medications. Patient denies myalgias or other medication SE's. Last cholesterol last visit was  , triglycerides  , HDL    and LDL   .     Patient has T2 NIDDM with last A1c    / insulin   . Recent labs in     Found a BUN/Creat of    With calc GFR of in              Patient denies reactive hypoglycemic symptoms, visual blurring, diabetic polys, or paresthesias.    Finally, patient has history of Vitamin D Deficiency of     In       with last vitamin D of        Medication List       This list is accurate as of: 09/23/13 12:12 AM.  Always use your most recent med list.               acetaminophen 500 MG tablet  Commonly known as:  TYLENOL  Take 500 mg by mouth every 6 (six) hours as needed.     aspirin 81 MG tablet  Take 81 mg by mouth daily.     atenolol 100 MG tablet  Commonly known as:  TENORMIN  Take 100 mg by mouth daily.     atorvastatin 80 MG tablet  Commonly known as:  LIPITOR  Take 40 mg by mouth daily.     enalapril 20 MG tablet  Commonly known as:  VASOTEC  Take 20 mg by mouth daily.     Fish Oil 1000 MG Caps  Take 1 capsule by mouth daily.     glipiZIDE 5 MG tablet  Commonly known as:  GLUCOTROL  Take 5 mg by mouth 2 (two) times daily before a meal.     LORazepam 2 MG tablet  Commonly known as:  ATIVAN  Take 1 mg by mouth at bedtime.     Magnesium 500 MG Caps  Take 500 mg by mouth daily.     metFORMIN 500 MG tablet  Commonly known as:  GLUCOPHAGE  Take by mouth 2 (two) times daily with a meal.     nitroGLYCERIN 0.4 MG SL tablet  Commonly known as:  NITROSTAT  Place 1 tablet (0.4 mg total) under the tongue every 5 (five) minutes as needed for chest pain.     omeprazole 20 MG capsule  Commonly known as:  PRILOSEC  Take 20 mg by mouth daily.     VITAMIN D PO  Take 8,000 Int'l Units by mouth daily.        Allergies  Allergen Reactions  . Actos [Pioglitazone]   . Onglyza [Saxagliptin]   . Percocet [Oxycodone-Acetaminophen]     hallucinations    Past Medical History  Diagnosis Date  . Diabetes mellitus without complication   . Hyperlipidemia   . Hypertension   . Vitamin D deficiency   .  GERD (gastroesophageal reflux disease)   . Type II or unspecified type diabetes mellitus with renal manifestations, not stated as uncontrolled     Past Surgical History  Procedure Laterality Date  . Appendectomy  1955  . Coronary artery bypass graft  2003  . Cardiac catheterization  1991, 1992, 2003  . Eye surgery  12/2006    Right CE/IOL    Family History  Problem Relation Age of Onset  . Heart disease Mother   . Diabetes Mother   . Cancer Father     History   Social History  . Marital Status: Married    Spouse Name: N/A    Number of Children: N/A  . Years of Education: N/A   Occupational History  . Not on file.   Social History Main Topics  . Smoking status: Former Smoker -- 4 years    Types: Cigarettes    Quit date: 08/28/1968  . Smokeless tobacco: Not on file  . Alcohol Use: Not on file  . Drug Use: Not on file  . Sexual Activity: Not on file   Other Topics Concern  . Not on file   Social History Narrative  . No narrative on file    ROS Constitutional: Denies fever, chills, weight loss/gain, headaches, insomnia, fatigue, night sweats, and change in appetite. Eyes: Denies redness, blurred vision, diplopia, discharge, itchy, watery eyes.  ENT:  Denies discharge, congestion, post nasal drip, epistaxis, sore throat, earache, hearing loss, dental pain, Tinnitus, Vertigo, Sinus pain, snoring.  Cardio: Denies chest pain, palpitations, irregular heartbeat, syncope, dyspnea, diaphoresis, orthopnea, PND, claudication, edema Respiratory: denies cough, dyspnea, DOE, pleurisy, hoarseness, laryngitis, wheezing.  Gastrointestinal: Denies dysphagia, heartburn, reflux, water brash, pain, cramps, nausea, vomiting, bloating, diarrhea, constipation, hematemesis, melena, hematochezia, jaundice, hemorrhoids Genitourinary: Denies dysuria, frequency, urgency, nocturia, hesitancy, discharge, hematuria, flank pain Musculoskeletal: Denies arthralgia, myalgia, stiffness, Jt. Swelling, pain, limp, and strain/sprain. Skin: Denies puritis, rash, hives, warts, acne, eczema, changing in skin lesion Neuro: No weakness, tremor, incoordination, spasms, paresthesia, pain Psychiatric: Denies confusion, memory loss, sensory loss Endocrine: Denies change in weight, skin, hair change, nocturia, and paresthesia, diabetic polys, visual blurring, hyper / hypo glycemic episodes.  Heme/Lymph: No excessive bleeding, bruising, or elarged lymph nodes.  There were no vitals filed for this visit.  Estimated body mass index is 28.98 kg/(m^2) as calculated from the following:   Height as of 09/08/13: 5\' 10"  (1.778 m).   Weight as of 09/08/13: 202 lb (91.627 kg).  Physical Exam General Appearance: Well nourished, in no apparent distress. Eyes: PERRLA, EOMs, conjunctiva no swelling or erythema, normal fundi and vessels. Sinuses: No frontal/maxillary tenderness ENT/Mouth: EACs patent / TMs  nl. Nares clear without erythema, swelling, mucoid exudates. Oral hygiene is good. No erythema, swelling, or exudate. Tongue normal, non-obstructing. Tonsils not swollen or erythematous. Hearing normal.  Neck: Supple, thyroid normal. No bruits, nodes or JVD. Respiratory: Respiratory effort normal.  BS  equal and clear bilateral without rales, rhonci, wheezing or stridor. Cardio: Heart sounds are normal with regular rate and rhythm and no murmurs, rubs or gallops. Peripheral pulses are normal and equal bilaterally without edema. No aortic or femoral bruits. Chest: symmetric with normal excursions and percussion.  Abdomen: Flat, soft, with bowl sounds. Nontender, no guarding, rebound, hernias, masses, or organomegaly.  Lymphatics: Non tender without lymphadenopathy.  Genitourinary: No hernias.Testes nl. DRE - prostate nl for age - smooth & firm w/o nodules. Musculoskeletal: Full ROM all peripheral extremities, joint stability, 5/5 strength, and normal gait. Skin: Warm  and dry without rashes, lesions, cyanosis, clubbing or  ecchymosis.  Neuro: Cranial nerves intact, reflexes equal bilaterally. Normal muscle tone, no cerebellar symptoms. Sensation intact.  Pysch: Awake and oriented X 3, normal affect, insight and judgment appropriate.   Assessment and Plan  1. Annual Screening Examination 2. Hypertension  3. Hyperlipidemia 4. T2 NIDDM/nephropathy 5. Vitamin D Deficiency 6. ASHD/CABG  Continue prudent diet as discussed, weight control, BP monitoring, regular exercise, and medications as discussed.  Discussed med effects and SE's. Routine screening labs and tests as requested with regular follow-up as recommended.

## 2013-09-23 NOTE — Patient Instructions (Signed)

## 2013-09-24 ENCOUNTER — Ambulatory Visit (HOSPITAL_COMMUNITY): Payer: Medicare Other | Attending: Cardiology | Admitting: Radiology

## 2013-09-24 ENCOUNTER — Encounter: Payer: Self-pay | Admitting: Cardiology

## 2013-09-24 DIAGNOSIS — R011 Cardiac murmur, unspecified: Secondary | ICD-10-CM

## 2013-09-24 DIAGNOSIS — E119 Type 2 diabetes mellitus without complications: Secondary | ICD-10-CM | POA: Insufficient documentation

## 2013-09-24 DIAGNOSIS — E785 Hyperlipidemia, unspecified: Secondary | ICD-10-CM | POA: Insufficient documentation

## 2013-09-24 DIAGNOSIS — I359 Nonrheumatic aortic valve disorder, unspecified: Secondary | ICD-10-CM

## 2013-09-24 DIAGNOSIS — I2510 Atherosclerotic heart disease of native coronary artery without angina pectoris: Secondary | ICD-10-CM

## 2013-09-24 DIAGNOSIS — Z87891 Personal history of nicotine dependence: Secondary | ICD-10-CM | POA: Insufficient documentation

## 2013-09-24 DIAGNOSIS — I10 Essential (primary) hypertension: Secondary | ICD-10-CM | POA: Insufficient documentation

## 2013-09-24 NOTE — Progress Notes (Signed)
Echocardiogram performed.  

## 2013-09-26 ENCOUNTER — Other Ambulatory Visit: Payer: Self-pay | Admitting: *Deleted

## 2013-09-26 ENCOUNTER — Encounter: Payer: Self-pay | Admitting: *Deleted

## 2013-09-26 DIAGNOSIS — I2510 Atherosclerotic heart disease of native coronary artery without angina pectoris: Secondary | ICD-10-CM

## 2013-09-29 ENCOUNTER — Telehealth: Payer: Self-pay | Admitting: Internal Medicine

## 2013-09-29 NOTE — Telephone Encounter (Signed)
New Message//Follow Up  Pt called states that he does not want to schedule another test until he has spoken with his Dr. Please call back to discuss

## 2013-09-29 NOTE — Telephone Encounter (Signed)
Spoke with pt, he wants to talk with dr Gaspar Skeeters about this nuclear stress testing before he schedules the testing. Questions regarding the test answered.

## 2013-10-03 ENCOUNTER — Other Ambulatory Visit: Payer: Self-pay | Admitting: *Deleted

## 2013-10-03 MED ORDER — GLIPIZIDE 5 MG PO TABS: 5.0000 mg | ORAL_TABLET | Freq: Three times a day (TID) | ORAL | Status: DC

## 2013-10-03 MED ORDER — ATENOLOL 100 MG PO TABS: 100.0000 mg | ORAL_TABLET | Freq: Every day | ORAL | Status: DC

## 2013-10-05 ENCOUNTER — Other Ambulatory Visit: Payer: Self-pay | Admitting: Internal Medicine

## 2013-10-28 ENCOUNTER — Encounter: Payer: Self-pay | Admitting: Internal Medicine

## 2013-10-28 ENCOUNTER — Ambulatory Visit (INDEPENDENT_AMBULATORY_CARE_PROVIDER_SITE_OTHER): Payer: Medicare Other | Admitting: Internal Medicine

## 2013-10-28 VITALS — BP 130/70 | HR 60 | Temp 98.1°F | Resp 16 | Ht 70.0 in | Wt 204.4 lb

## 2013-10-28 DIAGNOSIS — E1129 Type 2 diabetes mellitus with other diabetic kidney complication: Secondary | ICD-10-CM

## 2013-10-28 DIAGNOSIS — Z125 Encounter for screening for malignant neoplasm of prostate: Secondary | ICD-10-CM

## 2013-10-28 DIAGNOSIS — I10 Essential (primary) hypertension: Secondary | ICD-10-CM

## 2013-10-28 DIAGNOSIS — E559 Vitamin D deficiency, unspecified: Secondary | ICD-10-CM

## 2013-10-28 DIAGNOSIS — E785 Hyperlipidemia, unspecified: Secondary | ICD-10-CM

## 2013-10-28 DIAGNOSIS — Z0000 Encounter for general adult medical examination without abnormal findings: Secondary | ICD-10-CM

## 2013-10-28 DIAGNOSIS — I2510 Atherosclerotic heart disease of native coronary artery without angina pectoris: Secondary | ICD-10-CM | POA: Insufficient documentation

## 2013-10-28 DIAGNOSIS — Z79899 Other long term (current) drug therapy: Secondary | ICD-10-CM

## 2013-10-28 DIAGNOSIS — Z1212 Encounter for screening for malignant neoplasm of rectum: Secondary | ICD-10-CM

## 2013-10-28 LAB — CBC WITH DIFFERENTIAL/PLATELET
BASOS ABS: 0.1 10*3/uL (ref 0.0–0.1)
Basophils Relative: 1 % (ref 0–1)
Eosinophils Absolute: 0.3 10*3/uL (ref 0.0–0.7)
Eosinophils Relative: 4 % (ref 0–5)
HEMATOCRIT: 33.8 % — AB (ref 39.0–52.0)
Hemoglobin: 11.5 g/dL — ABNORMAL LOW (ref 13.0–17.0)
LYMPHS ABS: 2.2 10*3/uL (ref 0.7–4.0)
LYMPHS PCT: 30 % (ref 12–46)
MCH: 32.5 pg (ref 26.0–34.0)
MCHC: 34 g/dL (ref 30.0–36.0)
MCV: 95.5 fL (ref 78.0–100.0)
MONO ABS: 0.5 10*3/uL (ref 0.1–1.0)
Monocytes Relative: 7 % (ref 3–12)
Neutro Abs: 4.3 10*3/uL (ref 1.7–7.7)
Neutrophils Relative %: 58 % (ref 43–77)
Platelets: 246 10*3/uL (ref 150–400)
RBC: 3.54 MIL/uL — ABNORMAL LOW (ref 4.22–5.81)
RDW: 14.7 % (ref 11.5–15.5)
WBC: 7.4 10*3/uL (ref 4.0–10.5)

## 2013-10-28 LAB — LIPID PANEL
Cholesterol: 146 mg/dL (ref 0–200)
HDL: 38 mg/dL — AB (ref >39)
LDL Cholesterol: 79 mg/dL (ref 0–99)
TRIGLYCERIDES: 145 mg/dL (ref <150)
Total CHOL/HDL Ratio: 3.8 Ratio
VLDL: 29 mg/dL (ref 0–40)

## 2013-10-28 LAB — BASIC METABOLIC PANEL WITH GFR
BUN: 22 mg/dL (ref 6–23)
CHLORIDE: 99 meq/L (ref 96–112)
CO2: 26 meq/L (ref 19–32)
CREATININE: 1.12 mg/dL (ref 0.50–1.35)
Calcium: 9.9 mg/dL (ref 8.4–10.5)
GFR, EST NON AFRICAN AMERICAN: 63 mL/min
GFR, Est African American: 73 mL/min
Glucose, Bld: 174 mg/dL — ABNORMAL HIGH (ref 70–99)
POTASSIUM: 5.3 meq/L (ref 3.5–5.3)
Sodium: 136 mEq/L (ref 135–145)

## 2013-10-28 LAB — HEPATIC FUNCTION PANEL
ALBUMIN: 4.2 g/dL (ref 3.5–5.2)
ALK PHOS: 34 U/L — AB (ref 39–117)
ALT: 30 U/L (ref 0–53)
AST: 26 U/L (ref 0–37)
BILIRUBIN INDIRECT: 0.5 mg/dL (ref 0.2–1.2)
Bilirubin, Direct: 0.2 mg/dL (ref 0.0–0.3)
Total Bilirubin: 0.7 mg/dL (ref 0.2–1.2)
Total Protein: 6.9 g/dL (ref 6.0–8.3)

## 2013-10-28 LAB — MAGNESIUM: Magnesium: 1.8 mg/dL (ref 1.5–2.5)

## 2013-10-28 LAB — TSH: TSH: 1.32 u[IU]/mL (ref 0.350–4.500)

## 2013-10-28 LAB — HEMOGLOBIN A1C
HEMOGLOBIN A1C: 9.5 % — AB (ref <5.7)
Mean Plasma Glucose: 226 mg/dL — ABNORMAL HIGH (ref <117)

## 2013-10-28 LAB — PSA: PSA: 0.68 ng/mL (ref <=4.00)

## 2013-10-28 NOTE — Progress Notes (Signed)
Patient ID: Ricky Morales, male   DOB: July 04, 1935, 79 y.o.   MRN: [id number]   Annual Screening Comprehensive Examination  This very nice 79 y.o.  male presents for complete physical.  Patient has been followed for HTN, T2 NIDDM, ASHD, GERD,  Hyperlipidemia and Vitamin D Deficiency.   HTN predates since 49 when he had PTCA. Patient's BP has been controlled at home.Today's BP: 130/70 mmHg. Patient underwent CABG in 2003.  Patient denies any cardiac symptoms as chest pain, palpitations, shortness of breath, dizziness or ankle swelling.   Patient's hyperlipidemia is controlled with diet and medications. Patient denies myalgias or other medication SE's. Today's lipid profile as below are at goal.  Lab Results  Component Value Date   CHOL 146 10/28/2013   HDL 38* 10/28/2013   LDLCALC 79 10/28/2013   TRIG 145 10/28/2013   CHOLHDL 3.8 10/28/2013    Patient has T2 NIDDM  with today's  A1c 9.5% as he has been sporatig with his medications recently. Patient denies reactive hypoglycemic symptoms, visual blurring, diabetic polys, or paresthesias. Patient is receiving Avastin intraocular about every 4-6 weeks by Dr Renford Dills for Wet Macular Degeneration. In Sept 2014 his calculated GFR was 47 in moderate renal insufficiency range most likely due to diabetic glomulosclerosis.   Finally, patient has history of Vitamin D Deficiency of 24 in 2008 with last vitamin D 83 in Aug 2014.    Medication List       acetaminophen 500 MG tablet  Commonly known as:  TYLENOL  Take 500 mg by mouth every 6 (six) hours as needed.     aspirin 81 MG tablet  Take 81 mg by mouth daily.     atenolol 100 MG tablet  Commonly known as:  TENORMIN  Take 1 tablet (100 mg total) by mouth daily.     atorvastatin 80 MG tablet  Commonly known as:  LIPITOR  Take 40 mg by mouth daily.     enalapril 20 MG tablet  Commonly known as:  VASOTEC  Take 20 mg by mouth daily.     Fish Oil 1000 MG Caps  Take 1 capsule by mouth daily.      glipiZIDE 5 MG tablet  Commonly known as:  GLUCOTROL  Take 1 tablet (5 mg total) by mouth 3 (three) times daily before meals.     LORazepam 2 MG tablet  Commonly known as:  ATIVAN  Take 1 mg by mouth at bedtime.     Magnesium 500 MG Caps  Take 500 mg by mouth daily.     metFORMIN 500 MG tablet  Commonly known as:  GLUCOPHAGE  Take by mouth 2 (two) times daily with a meal.     nitroGLYCERIN 0.4 MG SL tablet  Commonly known as:  NITROSTAT  Place 1 tablet (0.4 mg total) under the tongue every 5 (five) minutes as needed for chest pain.     omeprazole 20 MG capsule  Commonly known as:  PRILOSEC  Take 20 mg by mouth daily.     VITAMIN D PO  Take 8,000 Int'l Units by mouth daily.       Allergies  Allergen Reactions  . Actos [Pioglitazone]   . Onglyza [Saxagliptin]   . Percocet [Oxycodone-Acetaminophen]     hallucinations    Past Medical History  Diagnosis Date  . Diabetes mellitus without complication   . Hyperlipidemia   . Hypertension   . Vitamin D deficiency   . GERD (gastroesophageal reflux disease)   .  Type II or unspecified type diabetes mellitus with renal manifestations, not stated as uncontrolled     Past Surgical History  Procedure Laterality Date  . Appendectomy  1955  . Coronary artery bypass graft  2003  . Cardiac catheterization  1991, 1992, 2003  . Eye surgery  12/2006    Right CE/IOL    Family History  Problem Relation Age of Onset  . Heart disease Mother   . Diabetes Mother   . Cancer Father     History   Social History  . Marital Status: Married    Spouse Name: N/A    Number of Children: N/A  . Years of Education: N/A   Occupational History  . Retired Copy.   Social History Main Topics  . Smoking status: Former Smoker -- 4 years    Types: Cigarettes    Quit date: 08/28/1968  . Smokeless tobacco: Not on file  . Alcohol Use: Yes     Comment: occasionally  . Drug Use: Not on file  . Sexual Activity: Not on file      ROS Constitutional: Denies fever, chills, weight loss/gain, headaches, insomnia, fatigue, night sweats, and change in appetite. Eyes: Denies redness, blurred vision, diplopia, discharge, itchy, watery eyes.  ENT: Denies discharge, congestion, post nasal drip, epistaxis, sore throat, earache, hearing loss, dental pain, Tinnitus, Vertigo, Sinus pain, snoring.  Cardio: Denies chest pain, palpitations, irregular heartbeat, syncope, dyspnea, diaphoresis, orthopnea, PND, claudication, edema Respiratory: denies cough, dyspnea, DOE, pleurisy, hoarseness, laryngitis, wheezing.  Gastrointestinal: Denies dysphagia, heartburn, reflux, water brash, pain, cramps, nausea, vomiting, bloating, diarrhea, constipation, hematemesis, melena, hematochezia, jaundice, hemorrhoids Genitourinary: Denies dysuria, frequency, urgency, nocturia, hesitancy, discharge, hematuria, flank pain Musculoskeletal: Denies arthralgia, myalgia, stiffness, Jt. Swelling, pain, limp, and strain/sprain. Skin: Denies puritis, rash, hives, warts, acne, eczema, changing in skin lesion Neuro: No weakness, tremor, incoordination, spasms, paresthesia, pain Psychiatric: Denies confusion, memory loss, sensory loss Endocrine: Denies change in weight, skin, hair change, nocturia, and paresthesia, diabetic polys, visual blurring, hyper / hypo glycemic episodes.  Heme/Lymph: No excessive bleeding, bruising, or elarged lymph nodes.  Filed Vitals:   10/28/13 1045  BP: 130/70  Pulse: 60  Temp: 98.1 F (36.7 C)  Resp: 16    Estimated body mass index is 29.33 kg/(m^2) as calculated from the following:   Height as of this encounter: 5\' 10"  (1.778 m).   Weight as of this encounter: 204 lb 6.4 oz (92.715 kg).  Physical Exam General Appearance: Well nourished, in no apparent distress. Eyes: PERRLA, EOMs, conjunctiva no swelling or erythema, normal fundi and vessels. Sinuses: No frontal/maxillary tenderness ENT/Mouth: EACs patent / TMs  nl.  Nares clear without erythema, swelling, mucoid exudates. Oral hygiene is good. No erythema, swelling, or exudate. Tongue normal, non-obstructing. Tonsils not swollen or erythematous. Hearing normal.  Neck: Supple, thyroid normal. No bruits, nodes or JVD. Respiratory: Respiratory effort normal.  BS equal and clear bilateral without rales, rhonci, wheezing or stridor. Cardio: Heart sounds are normal with regular rate and rhythm and no murmurs, rubs or gallops. Peripheral pulses are normal and equal bilaterally without edema. No aortic or femoral bruits. Chest: symmetric with normal excursions and percussion.  Abdomen: Flat, soft, with bowl sounds. Nontender, no guarding, rebound, hernias, masses, or organomegaly.  Lymphatics: Non tender without lymphadenopathy.  Genitourinary: No hernias.Testes nl. DRE - prostate nl for age - smooth & firm w/o nodules. Musculoskeletal: Full ROM all peripheral extremities, joint stability, 5/5 strength, and normal gait. Skin: Warm and dry  without rashes, lesions, cyanosis, clubbing or  ecchymosis.  Neuro: Cranial nerves intact, reflexes equal bilaterally. Normal muscle tone, no cerebellar symptoms. Sensation intact.  Pysch: Awake and oriented X 3, normal affect, insight and judgment appropriate.   Assessment and Plan  1. Annual Screening Examination 2. Hypertension - at goal  3. Hyperlipidemia - at goal 4. T2 NIDDM w/ CKD - poorly controlled due to poor compliance 5. Vitamin D Deficiency 6. ASHD/CABG  Continue prudent diet as discussed, weight control, BP monitoring, regular exercise, and medications as discussed.  Discussed med effects and SE's. Routine screening labs and tests as requested with regular follow-up as recommended. Will most likelyincrease his MF when all labs returned and reviewed.

## 2013-10-28 NOTE — Patient Instructions (Signed)

## 2013-10-29 LAB — URINALYSIS, MICROSCOPIC ONLY
Bacteria, UA: NONE SEEN
Casts: NONE SEEN
Crystals: NONE SEEN
Squamous Epithelial / HPF: NONE SEEN

## 2013-10-29 LAB — MICROALBUMIN / CREATININE URINE RATIO
Creatinine, Urine: 151 mg/dL
MICROALB/CREAT RATIO: 267.6 mg/g — AB (ref 0.0–30.0)
Microalb, Ur: 40.41 mg/dL — ABNORMAL HIGH (ref 0.00–1.89)

## 2013-10-29 LAB — INSULIN, FASTING: Insulin fasting, serum: 17 u[IU]/mL (ref 3–28)

## 2013-10-29 LAB — VITAMIN D 25 HYDROXY (VIT D DEFICIENCY, FRACTURES): Vit D, 25-Hydroxy: 89 ng/mL (ref 30–89)

## 2013-11-24 ENCOUNTER — Other Ambulatory Visit: Payer: Self-pay | Admitting: *Deleted

## 2013-11-24 MED ORDER — OMEPRAZOLE 40 MG PO CPDR: 40.0000 mg | DELAYED_RELEASE_CAPSULE | Freq: Every day | ORAL | Status: DC

## 2013-11-24 MED ORDER — ATENOLOL 100 MG PO TABS: 100.0000 mg | ORAL_TABLET | Freq: Every day | ORAL | Status: DC

## 2013-11-24 MED ORDER — METFORMIN HCL 500 MG PO TABS: 1000.0000 mg | ORAL_TABLET | Freq: Two times a day (BID) | ORAL | Status: DC

## 2013-11-24 MED ORDER — GLIPIZIDE 5 MG PO TABS: 5.0000 mg | ORAL_TABLET | Freq: Three times a day (TID) | ORAL | Status: DC

## 2013-12-26 ENCOUNTER — Other Ambulatory Visit: Payer: Self-pay | Admitting: *Deleted

## 2013-12-26 MED ORDER — LORAZEPAM 2 MG PO TABS: ORAL_TABLET | ORAL | Status: DC

## 2013-12-30 ENCOUNTER — Encounter: Payer: Self-pay | Admitting: *Deleted

## 2013-12-30 ENCOUNTER — Telehealth: Payer: Self-pay | Admitting: Internal Medicine

## 2013-12-30 DIAGNOSIS — R0602 Shortness of breath: Secondary | ICD-10-CM

## 2013-12-30 NOTE — Telephone Encounter (Signed)
Spoke with pt wife, the pt has heard horror stories about the nuclear stress test and has decided he does not want to do it. Explained the benefit of the nuclear testing over a regular GXT. The wife states she is going to talk with him when he gets back into town tomorrow. Will discuss with dr Harrington Challenger whether a GXT would even be beneficial  They are going to call back with their decision

## 2013-12-30 NOTE — Telephone Encounter (Signed)
New problem   Pt want to speak to nurse concerning having a regular stress test instead of the one with the injection. Please call pt.

## 2014-01-02 ENCOUNTER — Encounter: Payer: Self-pay | Admitting: *Deleted

## 2014-01-02 NOTE — Telephone Encounter (Signed)
Spoke with pt, nuclear testing procedure discuss and questions answered. lexiscan scheduled for pt, instructions discussed over the phone and mailed to the pt

## 2014-01-02 NOTE — Telephone Encounter (Signed)
Follow up     Have questions about nuclear stress test---pt thinks he want to reschedule it but want to talk to nurse first

## 2014-01-12 ENCOUNTER — Telehealth: Payer: Self-pay | Admitting: Internal Medicine

## 2014-01-12 NOTE — Telephone Encounter (Signed)
Spoke with pt, yesterday while working in the yard he develped a tight feeling in his chest. He reports he felt congested in the chest. The discomfort was in the right and left chest and felt like the muscles were sore. The tightness went away after he quit working but he just did not feel good. Later he was a little nauseated. He did take one NTG but is uncertain if that made a difference This is a different type pain from his previous heart pain He feels fine today and wants nuclear testing moved up Pt will have nuclear testing tomorrow Patient voiced understanding to go to the ER for any pain he is concerned about

## 2014-01-12 NOTE — Telephone Encounter (Signed)
New message     Pt is due to have a stress test next tues----yesterday, pt did not feel good, did not eat, took some nitro and was very restless. Pt said his chest hurt but the nitro helped.  Pt had CABG 57yrs ago. Pt is still sleeping this am

## 2014-01-13 ENCOUNTER — Ambulatory Visit (HOSPITAL_COMMUNITY): Payer: Medicare Other | Attending: Cardiovascular Disease | Admitting: Radiology

## 2014-01-13 VITALS — BP 109/64 | Ht 70.0 in | Wt 205.0 lb

## 2014-01-13 DIAGNOSIS — R0789 Other chest pain: Secondary | ICD-10-CM

## 2014-01-13 DIAGNOSIS — R0602 Shortness of breath: Secondary | ICD-10-CM | POA: Insufficient documentation

## 2014-01-13 MED ORDER — TECHNETIUM TC 99M SESTAMIBI GENERIC - CARDIOLITE
10.8000 | Freq: Once | INTRAVENOUS | Status: AC | PRN
  Administered 2014-01-13: 11 via INTRAVENOUS

## 2014-01-13 MED ORDER — REGADENOSON 0.4 MG/5ML IV SOLN
0.4000 mg | Freq: Once | INTRAVENOUS | Status: AC
  Administered 2014-01-13: 0.4 mg via INTRAVENOUS

## 2014-01-13 MED ORDER — TECHNETIUM TC 99M SESTAMIBI GENERIC - CARDIOLITE
33.0000 | Freq: Once | INTRAVENOUS | Status: AC | PRN
  Administered 2014-01-13: 33 via INTRAVENOUS

## 2014-01-13 NOTE — Progress Notes (Signed)
Ricky Morales [address] [place], [place] [address] [phone]    Cardiology Nuclear Med Study  Ricky Morales is a 79 y.o. male     MRN : [id number]     DOB: 10/29/1934  Procedure Date: 01/13/2014  Nuclear Med Background Indication for Stress Test:  Evaluation for Ischemia, Graft Patency and PTCA Patency History:  CAD-'03 CABG-PTCA-RCA-LAD- 1/15 ECHO: EF: 55% mod AS  Cardiac Risk Factors: Family History - CAD, History of Smoking, Hypertension, Lipids and NIDDM  Symptoms:  DOE, Palpitations and SOB   Nuclear Pre-Procedure Caffeine/Decaff Intake:  None> 12 hrs NPO After: 6:00pm   Lungs:  clear O2 Sat: 96% on room air. IV 0.9% NS with Angio Cath:  22g  IV Site: R Hand x 1, tolerated well IV Started by:  Irven Baltimore, RN  Chest Size (in):  44 Cup Size: n/a  Height: 5\' 10"  (1.778 m)  Weight:  205 lb (92.987 kg)  BMI:  Body mass index is 29.41 kg/(m^2). Tech Comments:  Patient took Atenolol this am, and held Glipizide and Metformin this am. Patient complaining of feeling anxious about the test. Patient brought his ativan and took a  2mg  tablet of Ativan at 10:20am on arrival.The patient's wife is driving. Irven Baltimore, RN.    Nuclear Med Study 1 or 2 day study: 1 day  Stress Test Type:  Lexiscan  Reading MD: N/A  Order Authorizing Provider:  Dorris Carnes, MD  Resting Radionuclide: Technetium 45m Sestamibi  Resting Radionuclide Dose: 11.0 mCi   Stress Radionuclide:  Technetium 80m Sestamibi  Stress Radionuclide Dose: 33.0 mCi           Stress Protocol Rest HR: 61 Stress HR: 70  Rest BP: 109/64 Stress BP: 118/62  Exercise Time (min): n/a METS: n/a   Predicted Max HR: 143 bpm % Max HR: 48.95 bpm Rate Pressure Product: 8260   Dose of Adenosine (mg):  n/a Dose of Lexiscan: 0.4 mg  Dose of Atropine (mg): n/a Dose of Dobutamine: n/a mcg/kg/min (at max HR)  Stress Test Technologist: Perrin Maltese, EMT-P  Nuclear Technologist:  Charlton Amor,  CNMT     Rest Procedure:  Myocardial perfusion imaging was performed at rest 45 minutes following the intravenous administration of Technetium 40m Sestamibi. Rest ECG: Sinus rhythm, nonspecific ST-T wave changes  Stress Procedure:  The patient received IV Lexiscan 0.4 mg over 15-seconds.  Technetium 56m Sestamibi injected at 30-seconds.This patient had sob with the Lexiscan injection.  Quantitative spect images were obtained after a 45 minute delay. Stress ECG: No significant change from baseline ECG Occasional PVC  QPS Raw Data Images:  Mild diaphragmatic attenuation; normal left ventricular size. Stress Images:  There is large inferoapical severe defect, inferoseptal consistent with previous scar. There is no evidence of peri-infarct ischemia. Rest Images:  As above. There is large defect inferoapical/inferoseptal consistent with scar. Subtraction (SDS):  No evidence of ischemia. Transient Ischemic Dilatation (Normal <1.22):  1.17 Lung/Heart Ratio (Normal <0.45):  0.41  Quantitative Gated Spect Images QGS EDV:  196 ml QGS ESV:  159 ml  Impression Exercise Capacity:  Lexiscan with no exercise. BP Response:  Normal blood pressure response. Clinical Symptoms:  Shortness of breath ECG Impression:  No significant ST segment change suggestive of ischemia. Comparison with Prior Nuclear Study: No images to compare  Overall Impression:  High risk stress nuclear study with large fixed inferior/inferoseptal scar, no ischemia.  LV Ejection Fraction: 19%.  LV Wall Motion:  There is inferior wall akinesis. Severe decreased left ventricular function.  Candee Furbish, MD

## 2014-01-16 ENCOUNTER — Encounter: Payer: Self-pay | Admitting: *Deleted

## 2014-01-16 ENCOUNTER — Other Ambulatory Visit: Payer: Self-pay | Admitting: *Deleted

## 2014-01-16 DIAGNOSIS — R9430 Abnormal result of cardiovascular function study, unspecified: Secondary | ICD-10-CM

## 2014-01-20 ENCOUNTER — Inpatient Hospital Stay (HOSPITAL_COMMUNITY)
Admission: EM | Admit: 2014-01-20 | Discharge: 2014-01-23 | DRG: 281 | Disposition: A | Discharge status: Home Health | Payer: Medicare Other | Attending: Cardiovascular Disease | Admitting: Cardiovascular Disease

## 2014-01-20 ENCOUNTER — Encounter (HOSPITAL_COMMUNITY): Payer: Self-pay | Admitting: Emergency Medicine

## 2014-01-20 ENCOUNTER — Emergency Department (HOSPITAL_COMMUNITY): Payer: Medicare Other

## 2014-01-20 ENCOUNTER — Ambulatory Visit (INDEPENDENT_AMBULATORY_CARE_PROVIDER_SITE_OTHER): Payer: Medicare Other | Admitting: *Deleted

## 2014-01-20 ENCOUNTER — Encounter (HOSPITAL_COMMUNITY): Payer: Medicare Other

## 2014-01-20 ENCOUNTER — Telehealth: Payer: Self-pay | Admitting: Cardiovascular Disease

## 2014-01-20 ENCOUNTER — Other Ambulatory Visit: Payer: Self-pay | Admitting: Internal Medicine

## 2014-01-20 DIAGNOSIS — I5021 Acute systolic (congestive) heart failure: Secondary | ICD-10-CM

## 2014-01-20 DIAGNOSIS — N179 Acute kidney failure, unspecified: Secondary | ICD-10-CM | POA: Diagnosis present

## 2014-01-20 DIAGNOSIS — Z9861 Coronary angioplasty status: Secondary | ICD-10-CM

## 2014-01-20 DIAGNOSIS — I214 Non-ST elevation (NSTEMI) myocardial infarction: Principal | ICD-10-CM | POA: Diagnosis present

## 2014-01-20 DIAGNOSIS — I2510 Atherosclerotic heart disease of native coronary artery without angina pectoris: Secondary | ICD-10-CM | POA: Diagnosis present

## 2014-01-20 DIAGNOSIS — I359 Nonrheumatic aortic valve disorder, unspecified: Secondary | ICD-10-CM | POA: Diagnosis present

## 2014-01-20 DIAGNOSIS — E785 Hyperlipidemia, unspecified: Secondary | ICD-10-CM | POA: Diagnosis present

## 2014-01-20 DIAGNOSIS — I2582 Chronic total occlusion of coronary artery: Secondary | ICD-10-CM | POA: Diagnosis present

## 2014-01-20 DIAGNOSIS — I350 Nonrheumatic aortic (valve) stenosis: Secondary | ICD-10-CM | POA: Diagnosis present

## 2014-01-20 DIAGNOSIS — E1129 Type 2 diabetes mellitus with other diabetic kidney complication: Secondary | ICD-10-CM | POA: Diagnosis present

## 2014-01-20 DIAGNOSIS — I129 Hypertensive chronic kidney disease with stage 1 through stage 4 chronic kidney disease, or unspecified chronic kidney disease: Secondary | ICD-10-CM

## 2014-01-20 DIAGNOSIS — R9430 Abnormal result of cardiovascular function study, unspecified: Secondary | ICD-10-CM

## 2014-01-20 DIAGNOSIS — E1165 Type 2 diabetes mellitus with hyperglycemia: Secondary | ICD-10-CM | POA: Diagnosis present

## 2014-01-20 DIAGNOSIS — N183 Chronic kidney disease, stage 3 unspecified: Secondary | ICD-10-CM

## 2014-01-20 DIAGNOSIS — Z5181 Encounter for therapeutic drug level monitoring: Secondary | ICD-10-CM

## 2014-01-20 DIAGNOSIS — Z87891 Personal history of nicotine dependence: Secondary | ICD-10-CM

## 2014-01-20 DIAGNOSIS — E1122 Type 2 diabetes mellitus with diabetic chronic kidney disease: Secondary | ICD-10-CM | POA: Diagnosis present

## 2014-01-20 DIAGNOSIS — I519 Heart disease, unspecified: Secondary | ICD-10-CM | POA: Diagnosis present

## 2014-01-20 DIAGNOSIS — I10 Essential (primary) hypertension: Secondary | ICD-10-CM | POA: Diagnosis present

## 2014-01-20 DIAGNOSIS — I25810 Atherosclerosis of coronary artery bypass graft(s) without angina pectoris: Secondary | ICD-10-CM | POA: Diagnosis present

## 2014-01-20 DIAGNOSIS — I200 Unstable angina: Secondary | ICD-10-CM

## 2014-01-20 LAB — I-STAT TROPONIN, ED: TROPONIN I, POC: 0.95 ng/mL — AB (ref 0.00–0.08)

## 2014-01-20 LAB — CBC WITH DIFFERENTIAL/PLATELET
Basophils Absolute: 0 10*3/uL (ref 0.0–0.1)
Basophils Relative: 0.5 % (ref 0.0–3.0)
EOS PCT: 3.8 % (ref 0.0–5.0)
Eosinophils Absolute: 0.3 10*3/uL (ref 0.0–0.7)
HEMATOCRIT: 34 % — AB (ref 39.0–52.0)
HEMOGLOBIN: 11.3 g/dL — AB (ref 13.0–17.0)
LYMPHS ABS: 2.1 10*3/uL (ref 0.7–4.0)
LYMPHS PCT: 24.8 % (ref 12.0–46.0)
MCHC: 33.1 g/dL (ref 30.0–36.0)
MCV: 96 fl (ref 78.0–100.0)
MONOS PCT: 5.1 % (ref 3.0–12.0)
Monocytes Absolute: 0.4 10*3/uL (ref 0.1–1.0)
Neutro Abs: 5.5 10*3/uL (ref 1.4–7.7)
Neutrophils Relative %: 65.8 % (ref 43.0–77.0)
PLATELETS: 223 10*3/uL (ref 150.0–400.0)
RBC: 3.55 Mil/uL — ABNORMAL LOW (ref 4.22–5.81)
RDW: 14.4 % (ref 11.5–15.5)
WBC: 8.4 10*3/uL (ref 4.0–10.5)

## 2014-01-20 LAB — PROTIME-INR
INR: 1.1 ratio — ABNORMAL HIGH (ref 0.8–1.0)
Prothrombin Time: 12.4 s (ref 9.6–13.1)

## 2014-01-20 LAB — BASIC METABOLIC PANEL
BUN: 33 mg/dL — AB (ref 6–23)
CO2: 24 mEq/L (ref 19–32)
Calcium: 9 mg/dL (ref 8.4–10.5)
Chloride: 105 mEq/L (ref 96–112)
Creatinine, Ser: 1.6 mg/dL — ABNORMAL HIGH (ref 0.4–1.5)
GFR: 44.75 mL/min — ABNORMAL LOW (ref >60.00)
Glucose, Bld: 226 mg/dL — ABNORMAL HIGH (ref 70–99)
Potassium: 5.2 mEq/L — ABNORMAL HIGH (ref 3.5–5.1)
SODIUM: 136 meq/L (ref 135–145)

## 2014-01-20 LAB — PRO B NATRIURETIC PEPTIDE: Pro B Natriuretic peptide (BNP): 2972 pg/mL — ABNORMAL HIGH (ref 0–450)

## 2014-01-20 MED ORDER — HEPARIN (PORCINE) IN NACL 100-0.45 UNIT/ML-% IJ SOLN
1050.0000 [IU]/h | INTRAMUSCULAR | Status: DC
  Administered 2014-01-20: 1200 [IU]/h via INTRAVENOUS
  Administered 2014-01-21: 1050 [IU]/h via INTRAVENOUS
  Filled 2014-01-20 (×3): qty 250

## 2014-01-20 MED ORDER — SODIUM CHLORIDE 0.9 % IV SOLN
INTRAVENOUS | Status: DC
  Administered 2014-01-21: 07:00:00 via INTRAVENOUS

## 2014-01-20 MED ORDER — LORAZEPAM 1 MG PO TABS
1.0000 mg | ORAL_TABLET | Freq: Every evening | ORAL | Status: DC | PRN
  Administered 2014-01-21 – 2014-01-22 (×3): 1 mg via ORAL
  Filled 2014-01-20 (×4): qty 1

## 2014-01-20 MED ORDER — ATENOLOL 50 MG PO TABS
50.0000 mg | ORAL_TABLET | Freq: Every day | ORAL | Status: DC
  Administered 2014-01-21 – 2014-01-22 (×2): 50 mg via ORAL
  Filled 2014-01-20 (×2): qty 1

## 2014-01-20 MED ORDER — PANTOPRAZOLE SODIUM 40 MG PO TBEC
40.0000 mg | DELAYED_RELEASE_TABLET | Freq: Every day | ORAL | Status: DC
  Administered 2014-01-21 – 2014-01-23 (×3): 40 mg via ORAL
  Filled 2014-01-20 (×3): qty 1

## 2014-01-20 MED ORDER — HEPARIN BOLUS VIA INFUSION
4000.0000 [IU] | Freq: Once | INTRAVENOUS | Status: AC
Start: 2014-01-20 — End: 2014-01-20
  Administered 2014-01-20: 4000 [IU] via INTRAVENOUS
  Filled 2014-01-20: qty 4000

## 2014-01-20 MED ORDER — NITROGLYCERIN 0.4 MG SL SUBL: 0.4000 mg | SUBLINGUAL_TABLET | SUBLINGUAL | Status: DC | PRN

## 2014-01-20 MED ORDER — ATORVASTATIN CALCIUM 80 MG PO TABS
80.0000 mg | ORAL_TABLET | Freq: Every day | ORAL | Status: DC
  Administered 2014-01-21 – 2014-01-22 (×2): 80 mg via ORAL
  Filled 2014-01-20 (×3): qty 1

## 2014-01-20 MED ORDER — INSULIN ASPART 100 UNIT/ML ~~LOC~~ SOLN
0.0000 [IU] | Freq: Three times a day (TID) | SUBCUTANEOUS | Status: DC
  Administered 2014-01-21: 2 [IU] via SUBCUTANEOUS
  Administered 2014-01-22: 1 [IU] via SUBCUTANEOUS
  Administered 2014-01-22: 2 [IU] via SUBCUTANEOUS
  Administered 2014-01-22: 3 [IU] via SUBCUTANEOUS
  Administered 2014-01-23: 2 [IU] via SUBCUTANEOUS

## 2014-01-20 MED ORDER — ADULT MULTIVITAMIN W/MINERALS CH
1.0000 | ORAL_TABLET | Freq: Every day | ORAL | Status: DC
  Administered 2014-01-21 – 2014-01-23 (×3): 1 via ORAL
  Filled 2014-01-20 (×3): qty 1

## 2014-01-20 MED ORDER — ASPIRIN EC 81 MG PO TBEC
81.0000 mg | DELAYED_RELEASE_TABLET | Freq: Every day | ORAL | Status: DC
  Administered 2014-01-21 – 2014-01-23 (×3): 81 mg via ORAL
  Filled 2014-01-20 (×3): qty 1

## 2014-01-20 NOTE — ED Notes (Signed)
Patient was given sandwich and sprite zero.

## 2014-01-20 NOTE — H&P (Signed)
Cardiology History and Physical  MCKEOWN,WILLIAM DAVID, MD Cardiologist: Dr. Dorris Carnes  History of Present Illness (and review of medical records): Ricky Morales is a 79 y.o. male who presents for evaluation of dyspnea on exertion.  He has known hx of CAD (s/p PTCA of RCA 1991; PTCA of LAD 1992) He underwent stress test then cath in 2003 which showed: LM 50%. LAD 70% mid; diffuse 50% mid; D1 70%; D2 50%; LCx 40%, OM2 90%, RCA 40 to 505; LVEF 55%. He went on to have CABG (LIMA to D2, LAD; SVG to D1; SVG to OM2; SVG to PDA).  He has not followed up with Cardiology since that time until Jan 2015 of this year.  He had 2D echo at that time revealing preserved LV function EF 55% with moderate aortic stenosis.  He saw his PCP in march for progressive dyspnea on exertion.  He had stress test done in May which was abnormal.  He was planned for cath today but had abnormal kidney function and this was postponed.  He comes in to ED today as his symptoms have gotten progressively worse.  Patient is normally fairly active and notices dyspnea on mild exertion.  He has to stop and rest to relieve his symptoms.  He does not report these episodes being associated with chest pain.  He has had maybe 1-2 episodes of short pressure in mid chest.  This week symptoms have continued to progress and felt could not wait to be seen in follow up.  In ED patient was found to have elevated BNP and troponin.  Cardiology was consulted for admission.  Echo 08/2013: Impressions: - Normal LV size and systolic function, EF 06%. Normal RV size and systolic function. Moderate aortic stenosis.  Nuclear perfusion study 01/13/2014: Impression  Exercise Capacity: Lexiscan with no exercise.  BP Response: Normal blood pressure response.  Clinical Symptoms: Shortness of breath  ECG Impression: No significant ST segment change suggestive of ischemia.  Comparison with Prior Nuclear Study: No images to compare  Overall Impression: High risk  stress nuclear study with large fixed inferior/inferoseptal scar, no ischemia.  LV Ejection Fraction: 19%. LV Wall Motion: There is inferior wall akinesis. Severe decreased left ventricular function.  Review of Systems Constitutional: negative for chills and night sweats Respiratory: negative for chronic bronchitis and wheezing Cardiovascular: negative for orthopnea, palpitations, paroxysmal nocturnal dyspnea and syncope Gastrointestinal: negative Neurological: negative Endocrine: positive for uncontrolled DM Further review of systems was otherwise negative other than stated in HPI.  Patient Active Problem List   Diagnosis Date Noted  . ACS (acute coronary syndrome) 01/20/2014  . ASHD/CABG 10/28/2013  . Encounter for long-term (current) use of other medications 10/28/2013  . T2 NIDDM w/ CKD   . Hyperlipidemia   . Hypertension   . Vitamin D deficiency   . GERD (gastroesophageal reflux disease)    Past Medical History  Diagnosis Date  . Diabetes mellitus without complication   . Hyperlipidemia   . Hypertension   . Vitamin D deficiency   . GERD (gastroesophageal reflux disease)   . Type II or unspecified type diabetes mellitus with renal manifestations, not stated as uncontrolled     Past Surgical History  Procedure Laterality Date  . Appendectomy  1955  . Coronary artery bypass graft  2003  . Cardiac catheterization  1991, 1992, 2003  . Eye surgery  12/2006    Right CE/IOL     (Not in a hospital admission) Allergies  Allergen Reactions  .  Actos [Pioglitazone] Other (See Comments)    Excessive weight gain  . Percocet [Oxycodone-Acetaminophen] Other (See Comments)    hallucinations    History  Substance Use Topics  . Smoking status: Former Smoker -- 4 years    Types: Cigarettes    Quit date: 08/28/1968  . Smokeless tobacco: Not on file  . Alcohol Use: Yes     Comment: occasionally    Family History  Problem Relation Age of Onset  . Heart disease Mother   .  Diabetes Mother   . Cancer Father      Objective:  Patient Vitals for the past 8 hrs:  BP Pulse Resp SpO2  01/20/14 2006 113/80 mmHg 58 18 96 %  01/20/14 1930 123/62 mmHg 59 17 97 %  01/20/14 1920 133/71 mmHg 63 - 99 %  01/20/14 1815 116/52 mmHg 61 18 97 %  01/20/14 1700 142/67 mmHg 61 18 96 %   General appearance: alert, cooperative, appears stated age and no distress Head: Normocephalic, without obvious abnormality, atraumatic Eyes: conjunctivae/corneas clear. PERRL, EOM's intact. Fundi benign. Neck: no carotid bruit, no JVD and supple, symmetrical, trachea midline Lungs: clear to auscultation bilaterally Chest wall: no tenderness Heart: regular rate and rhythm, S1, S2 normal,  Abdomen: soft, non-tender; bowel sounds normal; no masses,  no organomegaly Extremities: extremities normal, atraumatic, no cyanosis or edema Pulses: 2+ and symmetric Neurologic: Grossly normal  Results for orders placed during the hospital encounter of 01/20/14 (from the past 48 hour(s))  PRO B NATRIURETIC PEPTIDE     Status: Abnormal   Collection Time    01/20/14  5:10 PM      Result Value Ref Range   Pro B Natriuretic peptide (BNP) 2972.0 (*) 0 - 450 pg/mL  I-STAT TROPOININ, ED     Status: Abnormal   Collection Time    01/20/14  6:55 PM      Result Value Ref Range   Troponin i, poc 0.95 (*) 0.00 - 0.08 ng/mL   Comment NOTIFIED PHYSICIAN     Comment 3            Comment: Due to the release kinetics of cTnI,     a negative result within the first hours     of the onset of symptoms does not rule out     myocardial infarction with certainty.     If myocardial infarction is still suspected,     repeat the test at appropriate intervals.   Dg Chest 2 View  01/20/2014   CLINICAL DATA:  Shortness of breath.  EXAM: CHEST  2 VIEW  COMPARISON:  Chest x-ray 09/03/2013.  FINDINGS: Mediastinum and hilar structures normal. Prior CABG. Heart size and pulmonary vascularity normal. No pleural effusion or  pneumothorax. Degenerative changes thoracic spine.  IMPRESSION: 1.  Prior CABG.  2. No acute cardiopulmonary disease.   Electronically Signed   By: Marcello Moores  Register   On: 01/20/2014 19:03    ECG:  Sinus rhythm FAVB, ST &T wave abnormality in leads 2,3, avf, v4-v6, consider ischemia.  FAVB and ST&TWA are new compared to two most recent EKGs.  Assessment: ACS/NSTEMI Severe LV Dysfunction EF 19% Moderate Aortic Stenosis Acute on CKD DM, uncontrolled HTN HLD  Plan: 1. Cardiology Admission  2. Continuous monitoring on Telemetry. 3. Repeat ekg on admit, prn chest pain or arrythmia 4. Trend cardiac biomarkers, monitor renal function, electrolytes 5. Medical management to include ASA, Heparin gtt, BB, Statin, NTG prn 6. Hold ACEi, metformin 7. Keep NPO,  Gentle IVFs 8. Will likely need further ischemic evaluation/valve assessment with cardiac cath when renal function stable.

## 2014-01-20 NOTE — ED Notes (Signed)
Dr. Jacinto Reap. With cardiology allows patient to eat and drink at this time.

## 2014-01-20 NOTE — ED Notes (Signed)
Pt reports he is scheduled for heart catheterization here tomorrow and had blood work done this morning. Pt was called and told his kidney enzymes were abnormal and that they would not be able to go any further with process until he is evaluated. Pt also reports sob with exertion that has gotten worse over past week. Extensive cardiac hx. Pt denies cp.

## 2014-01-20 NOTE — Progress Notes (Signed)
ANTICOAGULATION CONSULT NOTE - Initial Consult  Pharmacy Consult for heparin Indication: chest pain/ACS  Allergies  Allergen Reactions  . Actos [Pioglitazone] Other (See Comments)    Excessive weight gain  . Percocet [Oxycodone-Acetaminophen] Other (See Comments)    hallucinations    Patient Measurements: weight 93 kg, height 70 inches   Heparin Dosing Weight: 93kg  Vital Signs: BP: 133/71 mmHg (05/26 1920) Pulse Rate: 63 (05/26 1920)  Labs:  Recent Labs  01/20/14 1056  HGB 11.3*  HCT 34.0*  PLT 223.0  LABPROT 12.4  INR 1.1*  CREATININE 1.6*    The CrCl is unknown because both a height and weight (above a minimum accepted value) are required for this calculation.   Medical History: Past Medical History  Diagnosis Date  . Diabetes mellitus without complication   . Hyperlipidemia   . Hypertension   . Vitamin D deficiency   . GERD (gastroesophageal reflux disease)   . Type II or unspecified type diabetes mellitus with renal manifestations, not stated as uncontrolled     Medications:  See med rec  Assessment: Patient is a 79 y.o M with abnormal stress test with plan for cardiac cath on 5/27.  Per outpatient note, this cath procedure was cancelled due to elevated scr.  Patient is now in the ED for worsening of SOB and with elevated troponin.  To start heparin for ACS.  Goal of Therapy:  Heparin level 0.3-0.7 units/ml Monitor platelets by anticoagulation protocol: Yes   Plan:  1) heparin 4000 units IV x1, then drip at 1200 units/hr 2) check 8 hour heparin level  Ashlee Bewley P Kiwanna Spraker 01/20/2014,7:33 PM

## 2014-01-20 NOTE — ED Notes (Signed)
Elevated Troponin reported to Dr. Zenia Resides

## 2014-01-20 NOTE — Telephone Encounter (Signed)
Pt was last seen in the office in January 2015. Stress test was arranged on the phone and was abnormal. Cardiac cath planned with me for tomorrow. I have not met the patient. Pre-cath labs with creatinine 1.6 (baseline 1.1). He will need to have cath cancelled for tomorrow and see Dr. Harrington Challenger or office PA/NP this week to discuss cath, repeat labs. Darlina Guys

## 2014-01-20 NOTE — ED Provider Notes (Signed)
CSN: [id number]     Arrival date & time 01/20/14  1638 History   First MD Initiated Contact with Patient 01/20/14 1651     Chief Complaint  Patient presents with  . Shortness of Breath     (Consider location/radiation/quality/duration/timing/severity/associated sxs/prior Treatment) Patient is a 79 y.o. male presenting with shortness of breath. The history is provided by the patient.  Shortness of Breath  patient here complaining of increasing shortness of breath x1 week. Denies any orthopnea but does have dyspnea on exertion. Denies any associated chest pain. No fever or chills. Recently filled a cardiac stress test and is scheduled for a heart catheterization in 2 days. Called his Dr. today and was told to come in for further evaluation due to his increasing dyspnea. Denies any leg pain or swelling. No recent black or bloody stools. Symptoms persistent and nothing makes them better worse. No treatment used prior to arrival  Past Medical History  Diagnosis Date  . Diabetes mellitus without complication   . Hyperlipidemia   . Hypertension   . Vitamin D deficiency   . GERD (gastroesophageal reflux disease)   . Type II or unspecified type diabetes mellitus with renal manifestations, not stated as uncontrolled    Past Surgical History  Procedure Laterality Date  . Appendectomy  1955  . Coronary artery bypass graft  2003  . Cardiac catheterization  1991, 1992, 2003  . Eye surgery  12/2006    Right CE/IOL   Family History  Problem Relation Age of Onset  . Heart disease Mother   . Diabetes Mother   . Cancer Father    History  Substance Use Topics  . Smoking status: Former Smoker -- 4 years    Types: Cigarettes    Quit date: 08/28/1968  . Smokeless tobacco: Not on file  . Alcohol Use: Yes     Comment: occasionally    Review of Systems  Respiratory: Positive for shortness of breath.   All other systems reviewed and are negative.     Allergies  Actos and Percocet  Home  Medications   Prior to Admission medications   Medication Sig Start Date End Date Taking? Authorizing Provider  aspirin 81 MG tablet Take 81 mg by mouth daily.    Historical Provider, MD  atenolol (TENORMIN) 100 MG tablet Take 1 tablet (100 mg total) by mouth daily. 11/24/13   Unk Pinto, MD  atorvastatin (LIPITOR) 80 MG tablet Take 40 mg by mouth daily.     Historical Provider, MD  Cholecalciferol (VITAMIN D PO) Take 8,000 Int'l Units by mouth daily.    Historical Provider, MD  enalapril (VASOTEC) 20 MG tablet Take 20 mg by mouth daily.    Historical Provider, MD  glipiZIDE (GLUCOTROL) 5 MG tablet Take 1 tablet (5 mg total) by mouth 3 (three) times daily before meals. 11/24/13   Unk Pinto, MD  LORazepam (ATIVAN) 2 MG tablet 1/2 -1 tab at bedtime as needed for sleep 12/26/13   Unk Pinto, MD  Magnesium 500 MG CAPS Take 500 mg by mouth daily.    Historical Provider, MD  metFORMIN (GLUCOPHAGE) 500 MG tablet Take 2 tablets (1,000 mg total) by mouth 2 (two) times daily with a meal. 11/24/13   Unk Pinto, MD  nitroGLYCERIN (NITROSTAT) 0.4 MG SL tablet Place 1 tablet (0.4 mg total) under the tongue every 5 (five) minutes as needed for chest pain. 09/12/13 09/12/14  Ardis Hughs, PA-C  Omega-3 Fatty Acids (FISH OIL) 1000 MG CAPS Take  1 capsule by mouth daily.     Historical Provider, MD  omeprazole (PRILOSEC) 40 MG capsule Take 1 capsule (40 mg total) by mouth daily. 11/24/13   Unk Pinto, MD   There were no vitals taken for this visit. Physical Exam  Nursing note and vitals reviewed. Constitutional: He is oriented to person, place, and time. He appears well-developed and well-nourished.  Non-toxic appearance. No distress.  HENT:  Head: Normocephalic and atraumatic.  Eyes: Conjunctivae, EOM and lids are normal. Pupils are equal, round, and reactive to light.  Neck: Normal range of motion. Neck supple. No tracheal deviation present. No mass present.  Cardiovascular: Normal rate,  regular rhythm and normal heart sounds.  Exam reveals no gallop.   No murmur heard. Pulmonary/Chest: Effort normal and breath sounds normal. No stridor. No respiratory distress. He has no decreased breath sounds. He has no wheezes. He has no rhonchi. He has no rales.  Abdominal: Soft. Normal appearance and bowel sounds are normal. He exhibits no distension. There is no tenderness. There is no rebound and no CVA tenderness.  Musculoskeletal: Normal range of motion. He exhibits no edema and no tenderness.  Neurological: He is alert and oriented to person, place, and time. He has normal strength. No cranial nerve deficit or sensory deficit. GCS eye subscore is 4. GCS verbal subscore is 5. GCS motor subscore is 6.  Skin: Skin is warm and dry. No abrasion and no rash noted.  Psychiatric: He has a normal mood and affect. His speech is normal and behavior is normal.    ED Course  Procedures (including critical care time) Labs Review Labs Reviewed  PRO B NATRIURETIC PEPTIDE    Imaging Review No results found.   EKG Interpretation   Date/Time:  Tuesday Jan 20 2014 16:42:10 EDT Ventricular Rate:  63 PR Interval:  238 QRS Duration: 96 QT Interval:  428 QTC Calculation: 437 R Axis:   52 Text Interpretation:  Sinus rhythm with 1st degree A-V block ST \\T \ T wave  abnormality, consider inferolateral ischemia  (new from prior study)  Abnormal ECG Confirmed by Zenia Resides  MD, Hazelyn Kallen ([id number]) on 01/20/2014 4:52:45  PM      MDM   Final diagnoses:  None    Pt without pain , now has nstemi , heparin per pharmacy--will admit to cardiology    Leota Jacobsen, MD 01/20/14 2018

## 2014-01-20 NOTE — Telephone Encounter (Signed)
Spoke with pt wife, she is aware cath is cancelled. Follow up scheduled with dr Harrington Challenger for Friday. The wife reports the pt is getting worse. She reports he is very fatigued and is SOB with little exertion. She is concerned he will not make it to Friday. Discussed with dr Harrington Challenger, pt referred to the hosp. Wife voiced understanding.

## 2014-01-21 ENCOUNTER — Ambulatory Visit (HOSPITAL_COMMUNITY): Admission: RE | Admit: 2014-01-21 | Payer: Medicare Other | Source: Ambulatory Visit | Admitting: Cardiovascular Disease

## 2014-01-21 ENCOUNTER — Encounter (HOSPITAL_COMMUNITY)
Admission: EM | Disposition: A | Discharge status: Home Health | Payer: Self-pay | Source: Home / Self Care | Attending: Cardiovascular Disease

## 2014-01-21 ENCOUNTER — Encounter (HOSPITAL_COMMUNITY): Admission: RE | Payer: Self-pay | Source: Ambulatory Visit

## 2014-01-21 DIAGNOSIS — I359 Nonrheumatic aortic valve disorder, unspecified: Secondary | ICD-10-CM

## 2014-01-21 DIAGNOSIS — I2510 Atherosclerotic heart disease of native coronary artery without angina pectoris: Secondary | ICD-10-CM

## 2014-01-21 DIAGNOSIS — E1129 Type 2 diabetes mellitus with other diabetic kidney complication: Secondary | ICD-10-CM

## 2014-01-21 DIAGNOSIS — E785 Hyperlipidemia, unspecified: Secondary | ICD-10-CM

## 2014-01-21 DIAGNOSIS — I350 Nonrheumatic aortic (valve) stenosis: Secondary | ICD-10-CM | POA: Diagnosis present

## 2014-01-21 HISTORY — PX: LEFT AND RIGHT HEART CATHETERIZATION WITH CORONARY/GRAFT ANGIOGRAM: SHX5448

## 2014-01-21 LAB — POCT I-STAT 3, VENOUS BLOOD GAS (G3P V)
Acid-base deficit: 9 mmol/L — ABNORMAL HIGH (ref 0.0–2.0)
BICARBONATE: 15.2 meq/L — AB (ref 20.0–24.0)
O2 Saturation: 58 %
PCO2 VEN: 27.3 mmHg — AB (ref 45.0–50.0)
PH VEN: 7.354 — AB (ref 7.250–7.300)
PO2 VEN: 31 mmHg (ref 30.0–45.0)
TCO2: 16 mmol/L (ref 0–100)

## 2014-01-21 LAB — CBC
HCT: 32.4 % — ABNORMAL LOW (ref 39.0–52.0)
HEMOGLOBIN: 10.8 g/dL — AB (ref 13.0–17.0)
MCH: 31.2 pg (ref 26.0–34.0)
MCHC: 33.3 g/dL (ref 30.0–36.0)
MCV: 93.6 fL (ref 78.0–100.0)
Platelets: 228 10*3/uL (ref 150–400)
RBC: 3.46 MIL/uL — ABNORMAL LOW (ref 4.22–5.81)
RDW: 13.8 % (ref 11.5–15.5)
WBC: 8.7 10*3/uL (ref 4.0–10.5)

## 2014-01-21 LAB — BASIC METABOLIC PANEL
BUN: 33 mg/dL — ABNORMAL HIGH (ref 6–23)
BUN: 34 mg/dL — AB (ref 6–23)
CALCIUM: 9.1 mg/dL (ref 8.4–10.5)
CALCIUM: 9.4 mg/dL (ref 8.4–10.5)
CHLORIDE: 105 meq/L (ref 96–112)
CO2: 21 mEq/L (ref 19–32)
CO2: 21 meq/L (ref 19–32)
CREATININE: 1.43 mg/dL — AB (ref 0.50–1.35)
Chloride: 104 mEq/L (ref 96–112)
Creatinine, Ser: 1.46 mg/dL — ABNORMAL HIGH (ref 0.50–1.35)
GFR calc Af Amer: 53 mL/min — ABNORMAL LOW (ref >90)
GFR calc Af Amer: 52 mL/min — ABNORMAL LOW (ref >90)
GFR calc non Af Amer: 46 mL/min — ABNORMAL LOW (ref >90)
GFR, EST NON AFRICAN AMERICAN: 45 mL/min — AB (ref >90)
GLUCOSE: 176 mg/dL — AB (ref 70–99)
Glucose, Bld: 196 mg/dL — ABNORMAL HIGH (ref 70–99)
Potassium: 5 mEq/L (ref 3.7–5.3)
Potassium: 5.7 mEq/L — ABNORMAL HIGH (ref 3.7–5.3)
SODIUM: 137 meq/L (ref 137–147)
SODIUM: 139 meq/L (ref 137–147)

## 2014-01-21 LAB — POCT I-STAT 3, ART BLOOD GAS (G3+)
Acid-base deficit: 6 mmol/L — ABNORMAL HIGH (ref 0.0–2.0)
BICARBONATE: 18.2 meq/L — AB (ref 20.0–24.0)
O2 SAT: 94 %
PCO2 ART: 29.8 mmHg — AB (ref 35.0–45.0)
PO2 ART: 71 mmHg — AB (ref 80.0–100.0)
TCO2: 19 mmol/L (ref 0–100)
pH, Arterial: 7.394 (ref 7.350–7.450)

## 2014-01-21 LAB — GLUCOSE, CAPILLARY
GLUCOSE-CAPILLARY: 175 mg/dL — AB (ref 70–99)
Glucose-Capillary: 196 mg/dL — ABNORMAL HIGH (ref 70–99)
Glucose-Capillary: 190 mg/dL — ABNORMAL HIGH (ref 70–99)
Glucose-Capillary: 174 mg/dL — ABNORMAL HIGH (ref 70–99)

## 2014-01-21 LAB — TROPONIN I
TROPONIN I: 0.61 ng/mL — AB (ref <0.30)
TROPONIN I: 0.66 ng/mL — AB (ref <0.30)
Troponin I: 0.57 ng/mL (ref <0.30)

## 2014-01-21 LAB — MAGNESIUM: Magnesium: 2.1 mg/dL (ref 1.5–2.5)

## 2014-01-21 LAB — PROTIME-INR
INR: 1.22 (ref 0.00–1.49)
Prothrombin Time: 15.1 seconds (ref 11.6–15.2)

## 2014-01-21 LAB — MRSA PCR SCREENING: MRSA by PCR: NEGATIVE

## 2014-01-21 LAB — HEPARIN LEVEL (UNFRACTIONATED)
HEPARIN UNFRACTIONATED: 0.67 [IU]/mL (ref 0.30–0.70)
Heparin Unfractionated: 0.83 IU/mL — ABNORMAL HIGH (ref 0.30–0.70)

## 2014-01-21 SURGERY — LEFT AND RIGHT HEART CATHETERIZATION WITH CORONARY/GRAFT ANGIOGRAM
Anesthesia: LOCAL

## 2014-01-21 SURGERY — LEFT HEART CATHETERIZATION WITH CORONARY/GRAFT ANGIOGRAM
Anesthesia: LOCAL

## 2014-01-21 MED ORDER — NITROGLYCERIN 0.2 MG/ML ON CALL CATH LAB
INTRAVENOUS | Status: AC
  Filled 2014-01-21: qty 1

## 2014-01-21 MED ORDER — AMLODIPINE BESYLATE 5 MG PO TABS
5.0000 mg | ORAL_TABLET | Freq: Every day | ORAL | Status: DC
  Administered 2014-01-21 – 2014-01-23 (×3): 5 mg via ORAL
  Filled 2014-01-21 (×3): qty 1

## 2014-01-21 MED ORDER — SODIUM CHLORIDE 0.9 % IV SOLN: INTRAVENOUS | Status: AC

## 2014-01-21 MED ORDER — LIDOCAINE HCL (PF) 1 % IJ SOLN
INTRAMUSCULAR | Status: AC
  Filled 2014-01-21: qty 30

## 2014-01-21 MED ORDER — SODIUM CHLORIDE 0.9 % IJ SOLN: 3.0000 mL | INTRAMUSCULAR | Status: DC | PRN

## 2014-01-21 MED ORDER — MIDAZOLAM HCL 2 MG/2ML IJ SOLN
INTRAMUSCULAR | Status: AC
  Filled 2014-01-21: qty 2

## 2014-01-21 MED ORDER — HEPARIN SODIUM (PORCINE) 1000 UNIT/ML IJ SOLN
INTRAMUSCULAR | Status: AC
  Filled 2014-01-21: qty 1

## 2014-01-21 MED ORDER — SODIUM CHLORIDE 0.9 % IV SOLN: 1.0000 mL/kg/h | INTRAVENOUS | Status: DC

## 2014-01-21 MED ORDER — SODIUM CHLORIDE 0.9 % IV SOLN
INTRAVENOUS | Status: DC
  Administered 2014-01-21: 05:00:00 via INTRAVENOUS

## 2014-01-21 MED ORDER — FENTANYL CITRATE 0.05 MG/ML IJ SOLN
INTRAMUSCULAR | Status: AC
  Filled 2014-01-21: qty 2

## 2014-01-21 MED ORDER — VERAPAMIL HCL 2.5 MG/ML IV SOLN
INTRAVENOUS | Status: AC
  Filled 2014-01-21: qty 2

## 2014-01-21 MED ORDER — HEPARIN (PORCINE) IN NACL 100-0.45 UNIT/ML-% IJ SOLN
1050.0000 [IU]/h | INTRAMUSCULAR | Status: DC
  Administered 2014-01-21: 1050 [IU]/h via INTRAVENOUS
  Filled 2014-01-21: qty 250

## 2014-01-21 MED ORDER — SODIUM CHLORIDE 0.9 % IV SOLN: 250.0000 mL | INTRAVENOUS | Status: DC | PRN

## 2014-01-21 MED ORDER — HEPARIN (PORCINE) IN NACL 2-0.9 UNIT/ML-% IJ SOLN
INTRAMUSCULAR | Status: AC
  Filled 2014-01-21: qty 1500

## 2014-01-21 MED ORDER — SODIUM CHLORIDE 0.9 % IJ SOLN
3.0000 mL | Freq: Two times a day (BID) | INTRAMUSCULAR | Status: DC
  Administered 2014-01-21: 3 mL via INTRAVENOUS

## 2014-01-21 NOTE — H&P (View-Only) (Signed)
TELEMETRY: Reviewed telemetry pt in NSR: Filed Vitals:   01/21/14 0500 01/21/14 0600 01/21/14 0700 01/21/14 0800  BP: 140/67 121/55 154/78 174/78  Pulse:    66  Temp:    97.6 F (36.4 C)  TempSrc:    Oral  Resp: 14 15 22 15   Height:      Weight:      SpO2: 97% 96% 92% 96%    Intake/Output Summary (Last 24 hours) at 01/21/14 0828 Last data filed at 01/21/14 0800  Gross per 24 hour  Intake 310.58 ml  Output    790 ml  Net -479.42 ml   Filed Weights   01/20/14 2334 01/21/14 0000 01/21/14 0459  Weight: 213 lb 13.5 oz (97 kg) 213 lb 13.5 oz (97 kg) 213 lb 13.5 oz (97 kg)    Subjective Feels OK this am. Still has some SOB. No chest pain.   Marland Kitchen amLODipine  5 mg Oral Daily  . aspirin EC  81 mg Oral Daily  . atenolol  50 mg Oral Daily  . atorvastatin  80 mg Oral Daily  . insulin aspart  0-9 Units Subcutaneous TID WC  . multivitamin with minerals  1 tablet Oral Daily  . pantoprazole  40 mg Oral Daily     LABS: Basic Metabolic Panel:  Recent Labs  01/21/14 0019 01/21/14 0411  NA 139 137  K 5.0 5.7*  CL 105 104  CO2 21 21  GLUCOSE 176* 196*  BUN 33* 34*  CREATININE 1.43* 1.46*  CALCIUM 9.1 9.4  MG  --  2.1   Liver Function Tests: No results found for this basename: AST, ALT, ALKPHOS, BILITOT, PROT, ALBUMIN,  in the last 72 hours No results found for this basename: LIPASE, AMYLASE,  in the last 72 hours CBC:  Recent Labs  01/20/14 1056 01/21/14 0411  WBC 8.4 8.7  NEUTROABS 5.5  --   HGB 11.3* 10.8*  HCT 34.0* 32.4*  MCV 96.0 93.6  PLT 223.0 228   Cardiac Enzymes:  Recent Labs  01/20/14 0019 01/21/14 0532  TROPONINI 0.66* 0.61*   BNP:  Recent Labs  01/20/14 1710  PROBNP 2972.0*   Fasting Lipid Panel: No results found for this basename: CHOL, HDL, LDLCALC, TRIG, CHOLHDL, LDLDIRECT,  in the last 72 hours Thyroid Function Tests: No results found for this basename: TSH, T4TOTAL, FREET3, T3FREE, THYROIDAB,  in the last 72  hours   Radiology/Studies:  Dg Chest 2 View  01/20/2014   CLINICAL DATA:  Shortness of breath.  EXAM: CHEST  2 VIEW  COMPARISON:  Chest x-ray 09/03/2013.  FINDINGS: Mediastinum and hilar structures normal. Prior CABG. Heart size and pulmonary vascularity normal. No pleural effusion or pneumothorax. Degenerative changes thoracic spine.  IMPRESSION: 1.  Prior CABG.  2. No acute cardiopulmonary disease.   Electronically Signed   By: Marcello Moores  Register   On: 01/20/2014 19:03   Ecg: NSR with ST-T wave changes c/w inferolateral ischemia.  Nuclear perfusion study 01/13/2014:  Impression  Exercise Capacity: Lexiscan with no exercise.  BP Response: Normal blood pressure response.  Clinical Symptoms: Shortness of breath  ECG Impression: No significant ST segment change suggestive of ischemia.  Comparison with Prior Nuclear Study: No images to compare  Overall Impression: High risk stress nuclear study with large fixed inferior/inferoseptal scar, no ischemia.  LV Ejection Fraction: 19%. LV Wall Motion: There is inferior wall akinesis. Severe decreased left ventricular function.   Echo: 1/28/15Study Conclusions  - Left ventricle: The cavity size was  normal. Wall thickness was normal. The estimated ejection fraction was 55%. Wall motion was normal; there were no regional wall motion abnormalities. Doppler parameters are consistent with abnormal left ventricular relaxation (grade 1 diastolic dysfunction). - Aortic valve: Trileaflet; severely calcified leaflets. There was moderate stenosis. Mean gradient: 65mm Hg (S). Peak gradient: 83mm Hg (S). Valve area: 1.01cm^2(VTI). - Mitral valve: Mildly calcified annulus. Mildly calcified leaflets . Trivial regurgitation. - Left atrium: The atrium was mildly dilated. - Right ventricle: The cavity size was normal. Systolic function was normal. - Pulmonary arteries: No complete TR doppler jet so unable to estimate PA systolic pressure. - Systemic veins: IVC  was not visualized. Impressions:  - Normal LV size and systolic function, EF 54%. Normal RV size and systolic function. Moderate aortic stenosis.    PHYSICAL EXAM General: Well developed, well nourished, in no acute distress. Head: Normocephalic, atraumatic, sclera non-icteric, oropharynx is clear Neck: Negative for carotid bruits. JVD not elevated. No adenopathy Lungs: Clear bilaterally to auscultation without wheezes, rales, or rhonchi. Breathing is unlabored. Heart: RRR S1 S2 with grade 2/6 systolic murmur in the RUSB. No gallop.  Abdomen: Soft, non-tender, non-distended with normoactive bowel sounds. No hepatomegaly. No rebound/guarding. No obvious abdominal masses. Msk:  Strength and tone appears normal for age. Extremities: No clubbing, cyanosis or edema.  Distal pedal pulses are 2+ and equal bilaterally. Neuro: Alert and oriented X 3. Moves all extremities spontaneously. Psych:  Responds to questions appropriately with a normal affect.  ASSESSMENT AND PLAN: 1. NSTEMI. Suspect progressive CAD with probable graft failure. Known CAD s/p CABG by Dr. Roxan Hockey in 2003 with LIMA to D2 and LAD, SVG to D1, SVG to OM2, SVG to PDA. Inferolateral ST-T changes. Troponin elevated. High risk myoview study. Need to proceed with cardiac cath. He has chronic kidney disease stage 3 but is at his baseline (labs reviewed for last 6 months). Hydrated overnight so will proceed with cath today. Will do right and left heart cath with coronaries and grafts.The procedure and risks were reviewed including but not limited to death, myocardial infarction, stroke, arrythmias, bleeding, transfusion, emergency surgery, dye allergy, or renal dysfunction. The patient voices understanding and is agreeable to proceed.  2. Moderate Aortic stenosis.  3. Acute on chronic diastolic ? Systolic dysfunction. Significant discrepancy in EF by myoview and Echo. Will update Echo. ACEi on hold for cath.   4. CKD stage 3.   5.  IDDM with CKD. Metformin on hold. SSI.   6. HTN- BP elevated this am. Will start amlodipine.  7. Hyperlipidemia. On lipitor.    Present on Admission:  . ACS (acute coronary syndrome) . ASHD/CABG . Hypertension . Hyperlipidemia . T2 NIDDM w/ CKD . Aortic stenosis  Signed, Oak Dorey M [place], [place] 01/21/2014 8:28 AM

## 2014-01-21 NOTE — Progress Notes (Signed)
Inpatient Diabetes Program Recommendations  AACE/ADA: New Consensus Statement on Inpatient Glycemic Control (2013)  Target Ranges:  Prepandial:   less than 140 mg/dL      Peak postprandial:   less than 180 mg/dL (1-2 hours)      Critically ill patients:  140 - 180 mg/dL   Home regimen: Metformin 500 mg bid and Glipizide 5 mg bid. Inpatient Diabetes Program Recommendations Insulin - Basal: Pt may need an addition to diabetes meds regimen at home. Pt may need some lantus at home or one of the newer meds, i.e. GlP-1 receptor agonists. Pt has PCP. Will talk with pt when appropriate. Oral Agents: Please do not discharge on Metformin as pt has CKD-high potential for acidosis with Metformin HgbA1C: High at 9.5%  Outpatient Referral: Pt may benefit if able

## 2014-01-21 NOTE — Progress Notes (Signed)
Introduced pt to the learning channel and encouraged him to watch the education regarding PCI/cardiac cath - pt states, "no, thanks - I had one done before and remember what happens".  Made pt aware that if he changes his mind or if his wife might be interested in watching to please advise.  Pt remains pain free and continues watching tv - will continue to closely monitor.

## 2014-01-21 NOTE — Care Management Note (Addendum)
  Page 2 of 2   01/23/2014     2:30:18 PM CARE MANAGEMENT NOTE 01/23/2014  Patient:  ELVA, BREAKER   Account Number:  [id number]  Date Initiated:  01/21/2014  Documentation initiated by:  Elissa Hefty  Subjective/Objective Assessment:   adm w mi     Action/Plan:   lives w wife, pcp dr Unk Pinto   Anticipated DC Date:  01/23/2014   Anticipated DC Plan:  [place]  CM consult      [place]   Choice offered to / List presented to:  C-1 Patient        [place] arranged  HH-1 RN      [place].   Status of service:  Completed, signed off Medicare Important Message given?  YES (If response is "NO", the following Medicare IM given date fields will be blank) Date Medicare IM given:  01/23/2014 Date Additional Medicare IM given:    Discharge Disposition:  [place]  Per UR Regulation:  Reviewed for med. necessity/level of care/duration of stay  If discussed at Long Length of Stay Meetings, dates discussed:    Comments:  Caytlin Better RN, BSN, MSHL, CCM  Nurse - Case Manager, ([facility])  [phone]  01/23/2014 HHS:  RN Admission Diagnoses:  NSTEMI Discharge Diagnoses:  NSTEMI Active Problems:   NSTEMI   CKD stage 3   T2 NIDDM w/ CKD   Hyperlipidemia   Hypertension   ASHD/CABG   ACS (acute coronary syndrome)   Aortic stenosis  Disease MGMT Medication reconciliation needed post hospital d/c; patient and wife concerned about understanding once home.  Reviewed prior to d/c but needs reconciliation, and monitoring.  New meds initiated this admission.  PCP:  Dr. Alesia Richards  [phone]; Fax: [phone]

## 2014-01-21 NOTE — Progress Notes (Addendum)
TELEMETRY: Reviewed telemetry pt in NSR: Filed Vitals:   01/21/14 0500 01/21/14 0600 01/21/14 0700 01/21/14 0800  BP: 140/67 121/55 154/78 174/78  Pulse:    66  Temp:    97.6 F (36.4 C)  TempSrc:    Oral  Resp: 14 15 22 15   Height:      Weight:      SpO2: 97% 96% 92% 96%    Intake/Output Summary (Last 24 hours) at 01/21/14 0828 Last data filed at 01/21/14 0800  Gross per 24 hour  Intake 310.58 ml  Output    790 ml  Net -479.42 ml   Filed Weights   01/20/14 2334 01/21/14 0000 01/21/14 0459  Weight: 213 lb 13.5 oz (97 kg) 213 lb 13.5 oz (97 kg) 213 lb 13.5 oz (97 kg)    Subjective Feels OK this am. Still has some SOB. No chest pain.   Marland Kitchen amLODipine  5 mg Oral Daily  . aspirin EC  81 mg Oral Daily  . atenolol  50 mg Oral Daily  . atorvastatin  80 mg Oral Daily  . insulin aspart  0-9 Units Subcutaneous TID WC  . multivitamin with minerals  1 tablet Oral Daily  . pantoprazole  40 mg Oral Daily     LABS: Basic Metabolic Panel:  Recent Labs  01/21/14 0019 01/21/14 0411  NA 139 137  K 5.0 5.7*  CL 105 104  CO2 21 21  GLUCOSE 176* 196*  BUN 33* 34*  CREATININE 1.43* 1.46*  CALCIUM 9.1 9.4  MG  --  2.1   Liver Function Tests: No results found for this basename: AST, ALT, ALKPHOS, BILITOT, PROT, ALBUMIN,  in the last 72 hours No results found for this basename: LIPASE, AMYLASE,  in the last 72 hours CBC:  Recent Labs  01/20/14 1056 01/21/14 0411  WBC 8.4 8.7  NEUTROABS 5.5  --   HGB 11.3* 10.8*  HCT 34.0* 32.4*  MCV 96.0 93.6  PLT 223.0 228   Cardiac Enzymes:  Recent Labs  01/20/14 0019 01/21/14 0532  TROPONINI 0.66* 0.61*   BNP:  Recent Labs  01/20/14 1710  PROBNP 2972.0*   Fasting Lipid Panel: No results found for this basename: CHOL, HDL, LDLCALC, TRIG, CHOLHDL, LDLDIRECT,  in the last 72 hours Thyroid Function Tests: No results found for this basename: TSH, T4TOTAL, FREET3, T3FREE, THYROIDAB,  in the last 72  hours   Radiology/Studies:  Dg Chest 2 View  01/20/2014   CLINICAL DATA:  Shortness of breath.  EXAM: CHEST  2 VIEW  COMPARISON:  Chest x-ray 09/03/2013.  FINDINGS: Mediastinum and hilar structures normal. Prior CABG. Heart size and pulmonary vascularity normal. No pleural effusion or pneumothorax. Degenerative changes thoracic spine.  IMPRESSION: 1.  Prior CABG.  2. No acute cardiopulmonary disease.   Electronically Signed   By: Marcello Moores  Register   On: 01/20/2014 19:03   Ecg: NSR with ST-T wave changes c/w inferolateral ischemia.  Nuclear perfusion study 01/13/2014:  Impression  Exercise Capacity: Lexiscan with no exercise.  BP Response: Normal blood pressure response.  Clinical Symptoms: Shortness of breath  ECG Impression: No significant ST segment change suggestive of ischemia.  Comparison with Prior Nuclear Study: No images to compare  Overall Impression: High risk stress nuclear study with large fixed inferior/inferoseptal scar, no ischemia.  LV Ejection Fraction: 19%. LV Wall Motion: There is inferior wall akinesis. Severe decreased left ventricular function.   Echo: 1/28/15Study Conclusions  - Left ventricle: The cavity size was  normal. Wall thickness was normal. The estimated ejection fraction was 55%. Wall motion was normal; there were no regional wall motion abnormalities. Doppler parameters are consistent with abnormal left ventricular relaxation (grade 1 diastolic dysfunction). - Aortic valve: Trileaflet; severely calcified leaflets. There was moderate stenosis. Mean gradient: 65mm Hg (S). Peak gradient: 83mm Hg (S). Valve area: 1.01cm^2(VTI). - Mitral valve: Mildly calcified annulus. Mildly calcified leaflets . Trivial regurgitation. - Left atrium: The atrium was mildly dilated. - Right ventricle: The cavity size was normal. Systolic function was normal. - Pulmonary arteries: No complete TR doppler jet so unable to estimate PA systolic pressure. - Systemic veins: IVC  was not visualized. Impressions:  - Normal LV size and systolic function, EF 54%. Normal RV size and systolic function. Moderate aortic stenosis.    PHYSICAL EXAM General: Well developed, well nourished, in no acute distress. Head: Normocephalic, atraumatic, sclera non-icteric, oropharynx is clear Neck: Negative for carotid bruits. JVD not elevated. No adenopathy Lungs: Clear bilaterally to auscultation without wheezes, rales, or rhonchi. Breathing is unlabored. Heart: RRR S1 S2 with grade 2/6 systolic murmur in the RUSB. No gallop.  Abdomen: Soft, non-tender, non-distended with normoactive bowel sounds. No hepatomegaly. No rebound/guarding. No obvious abdominal masses. Msk:  Strength and tone appears normal for age. Extremities: No clubbing, cyanosis or edema.  Distal pedal pulses are 2+ and equal bilaterally. Neuro: Alert and oriented X 3. Moves all extremities spontaneously. Psych:  Responds to questions appropriately with a normal affect.  ASSESSMENT AND PLAN: 1. NSTEMI. Suspect progressive CAD with probable graft failure. Known CAD s/p CABG by Dr. Roxan Hockey in 2003 with LIMA to D2 and LAD, SVG to D1, SVG to OM2, SVG to PDA. Inferolateral ST-T changes. Troponin elevated. High risk myoview study. Need to proceed with cardiac cath. He has chronic kidney disease stage 3 but is at his baseline (labs reviewed for last 6 months). Hydrated overnight so will proceed with cath today. Will do right and left heart cath with coronaries and grafts.The procedure and risks were reviewed including but not limited to death, myocardial infarction, stroke, arrythmias, bleeding, transfusion, emergency surgery, dye allergy, or renal dysfunction. The patient voices understanding and is agreeable to proceed.  2. Moderate Aortic stenosis.  3. Acute on chronic diastolic ? Systolic dysfunction. Significant discrepancy in EF by myoview and Echo. Will update Echo. ACEi on hold for cath.   4. CKD stage 3.   5.  IDDM with CKD. Metformin on hold. SSI.   6. HTN- BP elevated this am. Will start amlodipine.  7. Hyperlipidemia. On lipitor.    Present on Admission:  . ACS (acute coronary syndrome) . ASHD/CABG . Hypertension . Hyperlipidemia . T2 NIDDM w/ CKD . Aortic stenosis  Signed, Tyshae Stair M [place], [place] 01/21/2014 8:28 AM

## 2014-01-21 NOTE — CV Procedure (Signed)
Left hand Right Heart Catheterization with Coronary and Bypass Graft Angiography Report  Ricky Morales  79 y.o.  male 03-27-1935  Procedure Date: 01/21/2014 Referring Physician: Martin[place], M.D. Primary Cardiologist: Martin[place], M.D.  INDICATIONS: Heart failure and elevated cardiac markers in a patient with remote history of coronary bypass grafting and recent reduction in LV systolic function.  PROCEDURE: 1. Left heart catheterization (we were unable to cross the aortic valve); 2. Right heart catheterization; 3. Coronary angiography; 4. Bypass graft angiography including left internal mammary  CONSENT:  The risks, benefits, and details of the procedure were explained in detail to the patient. Risks including death, stroke, heart attack, kidney injury, allergy, limb ischemia, bleeding and radiation injury were discussed.  The patient verbalized understanding and wanted to proceed.  Informed written consent was obtained.  PROCEDURE TECHNIQUE:  After Xylocaine anesthesia a 5 French Slender sheath was placed in the left radial artery with an angiocath and the modified Seldinger technique.  A 21-gauge Angiocath in the right antecubital vein was exchanged for a 5 French slender sheath for the purpose of right heart catheterization. Right heart hemodynamic recordings were made with a 5 French balloon tip catheter. Hemodynamic recordings were performed in each chamber. Coronary capillary wedge pressure was performed. A main pulmonary artery O2 saturation was obtained. Coronary angiography was done using a 5 F JR 4, JL4, JL 3.5 cm, and IMA catheter catheter.  Left ventriculography was not done because of inability to cross the aortic valve. Both left Judkins catheters, the right Judkins catheter and the internal mammary catheter were used but were unable to help [place] cross the valve. After significant attempts to cross the valve we terminated efforts.  The procedure was complicated by calcified aorta and  difficulty with   CONTRAST:  Total of 110 cc.  COMPLICATIONS:  None   HEMODYNAMICS:  Aortic pressure  137/68 mmHg; LV pressure not obtained; LVEDP not obtained; RA mean 16 mm mercury; RV 55/17 mmHg; PA 55/25 mmHg; PCWP(mean) 25 mm mercury; Cardiac Output 5.41 L per minute; AV gradient unable to cross the aortic valve  ANGIOGRAPHIC DATA:   The left main coronary artery is obstructed 50% in the ostium and 60% distally.  The left anterior descending artery is totally occluded in the midsegment. The septal perforators supply collaterals to the distal RCA.  The left circumflex artery is functionally occluded with each significant marginal being 100% obstructed in the mid vessel the.  The right coronary artery is totally occluded proximally.  BYPASS GRAFT ANGIOGRAPHY: Saphenous vein graft to the right coronary: Totally occluded, recent.  Saphenous vein graft to the obtuse marginal: Totally occluded  Saphenous vein graft to the diagonal: Totally occluded  Sequential LIMA to the second diagonal and LAD: Widely patent. The distal LAD/diagonal supplies faint collaterals to the circumflex and PDA  LEFT VENTRICULOGRAM:  Left ventricular angiogram was not done. We will unable to cross the aortic valve. Multiple attempts and wires were used to cross. This had more to do with the approach from the radial van the significance of the aortic valve stenosis.   IMPRESSIONS:  1. Bypass graft occlusive disease with total occlusion of the SVG to the RCA, SVG to the circumflex, and SVG to the diagonal.  2. Patent sequential left internal mammary graft to the diagonal and distal LAD.  3. Total occlusion of the LAD (MID), circumflex (total occlusion of all significant marginal branches), and RCA (Mid). The distal circumflex and RCA are filled faintly by collaterals.  4. Known significant left ventricular systolic dysfunction with the most recent echo suggesting an EF of 30%  5. Moderate aortic  stenosis   RECOMMENDATION:  The patient will need to have a viability study performed and if there is significant stunned/hibernating muscle, consideration of surgical revascularization could be discussed.  Heart failure management to include beta blocker/ACE/diuretics.  Consideration of AICD/resynch(if appropriate)

## 2014-01-21 NOTE — Progress Notes (Signed)
ANTICOAGULATION CONSULT NOTE - Follow Up Consult  Pharmacy Consult for Heparin Indication: chest pain/ACS  Allergies  Allergen Reactions  . Actos [Pioglitazone] Other (See Comments)    Excessive weight gain  . Percocet [Oxycodone-Acetaminophen] Other (See Comments)    hallucinations    Patient Measurements: Height: 5\' 10"  (177.8 cm) Weight: 213 lb 10 oz (96.9 kg) IBW/kg (Calculated) : 73 Heparin Dosing Weight: 93kg  Vital Signs: Temp: 97.6 F (36.4 C) (05/27 1630) Temp src: Oral (05/27 1630) BP: 159/69 mmHg (05/27 1645) Pulse Rate: 65 (05/27 1630)  Labs:  Recent Labs  01/20/14 0019 01/20/14 1056 01/21/14 0019 01/21/14 0411 01/21/14 0532 01/21/14 1205 01/21/14 1300  HGB  --  11.3*  --  10.8*  --   --   --   HCT  --  34.0*  --  32.4*  --   --   --   PLT  --  223.0  --  228  --   --   --   LABPROT  --  12.4  --  15.1  --   --   --   INR  --  1.1*  --  1.22  --   --   --   HEPARINUNFRC  --   --   --  0.83*  --   --  0.67  CREATININE  --  1.6* 1.43* 1.46*  --   --   --   TROPONINI 0.66*  --   --   --  0.61* 0.57*  --     Estimated Creatinine Clearance: 49.5 ml/min (by C-G formula based on Cr of 1.46).   Assessment: 79yom to resume heparin per pharmacy 8 hrs after sheath removal s/p cardiac cath for NSTEMI.  This morning the heparin level was therapeutic on 1500 units/hr.  CBC stable.  Sheath removed 1609, site with no bleeding or hematoma at 1614.    Goal of Therapy:  Heparin level 0.3-0.7 units/ml Monitor platelets by anticoagulation protocol: Yes   Plan:  1) resume heparin at 1050 units/hr 8 hrs after sheath removal (at midnight) 2) heparin level 8 hrs after drip resumed  3) daily HL and CBC while on heparin  Eudelia Bunch, Pharm.D. [phone] 01/21/2014 5:08 PM

## 2014-01-21 NOTE — Progress Notes (Signed)
Pt reports feeling short of breath this am during initial assessment pt O2 sat on RA remains 93-97% - 1L [place] O2 placed and pt states he feels "immediate relief".    [place], MD bedside when 0800 BP completed - ensure MD saw current BP.  Reviewed consent information and confirmed that all questions and concerns regarding cardiac cath procedure had been addressed by [place], MD - pt verbalizes understanding and signed consent - RN witnessed.  Pt remains NPO, contacted his wife with update of cardiac cath scheduled for this afternoon - pt currently watching tv with no c/o pain.  Will continue to closely monitor.

## 2014-01-21 NOTE — Progress Notes (Signed)
ANTICOAGULATION CONSULT NOTE - Follow Up Consult  Pharmacy Consult for Heparin  Indication: chest pain/ACS  Allergies  Allergen Reactions  . Actos [Pioglitazone] Other (See Comments)    Excessive weight gain  . Percocet [Oxycodone-Acetaminophen] Other (See Comments)    hallucinations    Patient Measurements: Height: 5\' 10"  (177.8 cm) Weight: 213 lb 13.5 oz (97 kg) IBW/kg (Calculated) : 73  Vital Signs: Temp: 97.7 F (36.5 C) (05/27 0459) Temp src: Oral (05/27 0459) BP: 144/72 mmHg (05/27 0000) Pulse Rate: 74 (05/26 2334)  Labs:  Recent Labs  01/20/14 0019 01/20/14 1056 01/21/14 0019 01/21/14 0411  HGB  --  11.3*  --  10.8*  HCT  --  34.0*  --  32.4*  PLT  --  223.0  --  228  LABPROT  --  12.4  --  15.1  INR  --  1.1*  --  1.22  HEPARINUNFRC  --   --   --  0.83*  CREATININE  --  1.6* 1.43*  --   TROPONINI 0.66*  --   --   --     Estimated Creatinine Clearance: 50.5 ml/min (by C-G formula based on Cr of 1.43).   Medications:  Heparin 1200 units/hr  Assessment: 79 y/o M on heparin for elevated troponin. First HL is 0.83. Other labs as above. No issues per RN.   Goal of Therapy:  Heparin level 0.3-0.7 units/ml Monitor platelets by anticoagulation protocol: Yes   Plan:  -Decrease heparin drip to 1050 units/hr -1300 HL -Daily CBC/HL -Monitor for bleeding  Narda Bonds 01/21/2014,5:05 AM

## 2014-01-21 NOTE — Interval H&P Note (Signed)
Cath Lab Visit (complete for each Cath Lab visit)  Clinical Evaluation Leading to the Procedure:   ACS: yes  Non-ACS:    Anginal Classification: CCS III  Anti-ischemic medical therapy: Minimal Therapy (1 class of medications)  Non-Invasive Test Results: No non-invasive testing performed  Prior CABG: Previous CABG      History and Physical Interval Note:  01/21/2014 2:49 PM  Ricky Morales  has presented today for surgery, with the diagnosis of cp  The various methods of treatment have been discussed with the patient and family. After consideration of risks, benefits and other options for treatment, the patient has consented to  Procedure(s): LEFT AND RIGHT HEART CATHETERIZATION WITH CORONARY/GRAFT ANGIOGRAM (N/A) as a surgical intervention .  The patient's history has been reviewed, patient examined, no change in status, stable for surgery.  I have reviewed the patient's chart and labs.  Questions were answered to the patient's satisfaction.     Belva Crome III

## 2014-01-21 NOTE — Progress Notes (Signed)
Echo Lab  2D Echocardiogram completed.  [place], RDCS 01/21/2014 11:55 AM

## 2014-01-21 NOTE — Progress Notes (Signed)
ANTICOAGULATION CONSULT NOTE - Follow Up Consult  Pharmacy Consult for Heparin Indication: chest pain/ACS  Allergies  Allergen Reactions  . Actos [Pioglitazone] Other (See Comments)    Excessive weight gain  . Percocet [Oxycodone-Acetaminophen] Other (See Comments)    hallucinations    Patient Measurements: Height: 5\' 10"  (177.8 cm) Weight: 213 lb 10 oz (96.9 kg) IBW/kg (Calculated) : 73 Heparin Dosing Weight: 93kg  Vital Signs: Temp: 97.9 F (36.6 C) (05/27 1100) Temp src: Oral (05/27 1100) BP: 140/64 mmHg (05/27 1400) Pulse Rate: 62 (05/27 1100)  Labs:  Recent Labs  01/20/14 0019 01/20/14 1056 01/21/14 0019 01/21/14 0411 01/21/14 0532 01/21/14 1205 01/21/14 1300  HGB  --  11.3*  --  10.8*  --   --   --   HCT  --  34.0*  --  32.4*  --   --   --   PLT  --  223.0  --  228  --   --   --   LABPROT  --  12.4  --  15.1  --   --   --   INR  --  1.1*  --  1.22  --   --   --   HEPARINUNFRC  --   --   --  0.83*  --   --  0.67  CREATININE  --  1.6* 1.43* 1.46*  --   --   --   TROPONINI 0.66*  --   --   --  0.61* 0.57*  --     Estimated Creatinine Clearance: 49.5 ml/min (by C-G formula based on Cr of 1.46).   Medications:  Heparin @ 1050 units/hr  Assessment: 79yom continues on heparin for NSTEMI with plans for cath today. Heparin level is therapeutic after rate decrease this morning. CBC is stable. No bleeding reported.  Goal of Therapy:  Heparin level 0.3-0.7 units/ml Monitor platelets by anticoagulation protocol: Yes   Plan:  1) Continue heparin at 1050 units/hr 2) Follow up after cath  Deboraha Sprang 01/21/2014,2:37 PM

## 2014-01-22 DIAGNOSIS — I2510 Atherosclerotic heart disease of native coronary artery without angina pectoris: Secondary | ICD-10-CM

## 2014-01-22 DIAGNOSIS — I509 Heart failure, unspecified: Secondary | ICD-10-CM

## 2014-01-22 DIAGNOSIS — I214 Non-ST elevation (NSTEMI) myocardial infarction: Secondary | ICD-10-CM

## 2014-01-22 DIAGNOSIS — I359 Nonrheumatic aortic valve disorder, unspecified: Secondary | ICD-10-CM

## 2014-01-22 DIAGNOSIS — I5021 Acute systolic (congestive) heart failure: Secondary | ICD-10-CM

## 2014-01-22 LAB — CBC
HCT: 31.2 % — ABNORMAL LOW (ref 39.0–52.0)
Hemoglobin: 10.5 g/dL — ABNORMAL LOW (ref 13.0–17.0)
MCH: 31.3 pg (ref 26.0–34.0)
MCHC: 33.7 g/dL (ref 30.0–36.0)
MCV: 93.1 fL (ref 78.0–100.0)
PLATELETS: 221 10*3/uL (ref 150–400)
RBC: 3.35 MIL/uL — AB (ref 4.22–5.81)
RDW: 13.8 % (ref 11.5–15.5)
WBC: 7.9 10*3/uL (ref 4.0–10.5)

## 2014-01-22 LAB — BASIC METABOLIC PANEL
BUN: 21 mg/dL (ref 6–23)
CO2: 22 mEq/L (ref 19–32)
Calcium: 9.2 mg/dL (ref 8.4–10.5)
Chloride: 102 mEq/L (ref 96–112)
Creatinine, Ser: 1.35 mg/dL (ref 0.50–1.35)
GFR, EST AFRICAN AMERICAN: 57 mL/min — AB (ref >90)
GFR, EST NON AFRICAN AMERICAN: 49 mL/min — AB (ref >90)
Glucose, Bld: 195 mg/dL — ABNORMAL HIGH (ref 70–99)
POTASSIUM: 4.6 meq/L (ref 3.7–5.3)
SODIUM: 136 meq/L — AB (ref 137–147)

## 2014-01-22 LAB — GLUCOSE, CAPILLARY
GLUCOSE-CAPILLARY: 224 mg/dL — AB (ref 70–99)
GLUCOSE-CAPILLARY: 190 mg/dL — AB (ref 70–99)
GLUCOSE-CAPILLARY: 196 mg/dL — AB (ref 70–99)
Glucose-Capillary: 180 mg/dL — ABNORMAL HIGH (ref 70–99)

## 2014-01-22 LAB — HEPARIN LEVEL (UNFRACTIONATED): HEPARIN UNFRACTIONATED: 0.17 [IU]/mL — AB (ref 0.30–0.70)

## 2014-01-22 MED ORDER — HEPARIN (PORCINE) IN NACL 100-0.45 UNIT/ML-% IJ SOLN
1300.0000 [IU]/h | INTRAMUSCULAR | Status: DC
  Administered 2014-01-22: 1300 [IU]/h via INTRAVENOUS

## 2014-01-22 MED ORDER — CARVEDILOL 25 MG PO TABS
25.0000 mg | ORAL_TABLET | Freq: Two times a day (BID) | ORAL | Status: DC
  Administered 2014-01-22 – 2014-01-23 (×2): 25 mg via ORAL
  Filled 2014-01-22 (×4): qty 1

## 2014-01-22 MED ORDER — ENALAPRIL MALEATE 20 MG PO TABS
20.0000 mg | ORAL_TABLET | Freq: Every day | ORAL | Status: DC
  Administered 2014-01-22 – 2014-01-23 (×2): 20 mg via ORAL
  Filled 2014-01-22 (×2): qty 1

## 2014-01-22 MED ORDER — HEPARIN SODIUM (PORCINE) 5000 UNIT/ML IJ SOLN
5000.0000 [IU] | Freq: Three times a day (TID) | INTRAMUSCULAR | Status: DC
  Administered 2014-01-22 – 2014-01-23 (×2): 5000 [IU] via SUBCUTANEOUS
  Filled 2014-01-22 (×5): qty 1

## 2014-01-22 MED ORDER — FUROSEMIDE 40 MG PO TABS
40.0000 mg | ORAL_TABLET | Freq: Every day | ORAL | Status: DC
  Administered 2014-01-22: 40 mg via ORAL
  Filled 2014-01-22 (×2): qty 1

## 2014-01-22 NOTE — Progress Notes (Signed)
ANTICOAGULATION CONSULT NOTE - Follow Up Consult  Pharmacy Consult for Heparin Indication: multivessel CAD  Allergies  Allergen Reactions  . Actos [Pioglitazone] Other (See Comments)    Excessive weight gain  . Percocet [Oxycodone-Acetaminophen] Other (See Comments)    hallucinations    Patient Measurements: Height: 5\' 10"  (177.8 cm) Weight: 213 lb 10 oz (96.9 kg) IBW/kg (Calculated) : 73 Heparin Dosing Weight: 93kg  Vital Signs: Temp: 97.7 F (36.5 C) (05/28 0700) Temp src: Oral (05/28 0700) BP: 126/51 mmHg (05/28 0700) Pulse Rate: 64 (05/28 0700)  Labs:  Recent Labs  01/20/14 0019  01/20/14 1056 01/21/14 0019 01/21/14 0411 01/21/14 0532 01/21/14 1205 01/21/14 1300 01/22/14 0304 01/22/14 0720  HGB  --   < > 11.3*  --  10.8*  --   --   --  10.5*  --   HCT  --   --  34.0*  --  32.4*  --   --   --  31.2*  --   PLT  --   --  223.0  --  228  --   --   --  221  --   LABPROT  --   --  12.4  --  15.1  --   --   --   --   --   INR  --   --  1.1*  --  1.22  --   --   --   --   --   HEPARINUNFRC  --   --   --   --  0.83*  --   --  0.67  --  0.17*  CREATININE  --   --  1.6* 1.43* 1.46*  --   --   --   --   --   TROPONINI 0.66*  --   --   --   --  0.61* 0.57*  --   --   --   < > = values in this interval not displayed.  Estimated Creatinine Clearance: 49.5 ml/min (by C-G formula based on Cr of 1.46).   Medications:  Heparin @ 1050 units/hr  Assessment: 79yom resumed on heparin after yesterday's cath for multivessel disease. May need CABG if muscle is viable - viability study pending. Heparin level this morning is below goal. No issues with infusion. CBC is stable. No bleeding reported.  Goal of Therapy:  Heparin level 0.3-0.7 units/ml Monitor platelets by anticoagulation protocol: Yes   Plan:  1) Increase heparin to 1300 units/hr 2) Check 8 hour heparin level  Benjamine Sprague Luka Stohr 01/22/2014,8:33 AM

## 2014-01-22 NOTE — Progress Notes (Signed)
TELEMETRY: Reviewed telemetry pt in NSR: Filed Vitals:   01/22/14 0800 01/22/14 0900 01/22/14 0922 01/22/14 1155  BP: 134/48 131/53 135/53 142/77  Pulse:    62  Temp:    98.7 F (37.1 C)  TempSrc:    Oral  Resp: 18 19    Height:      Weight:      SpO2: 93% 92%  94%    Intake/Output Summary (Last 24 hours) at 01/22/14 1347 Last data filed at 01/22/14 1000  Gross per 24 hour  Intake  830.3 ml  Output   1725 ml  Net -894.7 ml   Filed Weights   01/21/14 0000 01/21/14 0459 01/21/14 0845  Weight: 213 lb 13.5 oz (97 kg) 213 lb 13.5 oz (97 kg) 213 lb 10 oz (96.9 kg)    Subjective Feels OK this am. No SOB. No chest pain.   Marland Kitchen amLODipine  5 mg Oral Daily  . aspirin EC  81 mg Oral Daily  . atenolol  50 mg Oral Daily  . atorvastatin  80 mg Oral Daily  . insulin aspart  0-9 Units Subcutaneous TID WC  . multivitamin with minerals  1 tablet Oral Daily  . pantoprazole  40 mg Oral Daily     LABS: Basic Metabolic Panel:  Recent Labs  01/21/14 0019 01/21/14 0411  NA 139 137  K 5.0 5.7*  CL 105 104  CO2 21 21  GLUCOSE 176* 196*  BUN 33* 34*  CREATININE 1.43* 1.46*  CALCIUM 9.1 9.4  MG  --  2.1   Liver Function Tests: No results found for this basename: AST, ALT, ALKPHOS, BILITOT, PROT, ALBUMIN,  in the last 72 hours No results found for this basename: LIPASE, AMYLASE,  in the last 72 hours CBC:  Recent Labs  01/20/14 1056 01/21/14 0411 01/22/14 0304  WBC 8.4 8.7 7.9  NEUTROABS 5.5  --   --   HGB 11.3* 10.8* 10.5*  HCT 34.0* 32.4* 31.2*  MCV 96.0 93.6 93.1  PLT 223.0 228 221   Cardiac Enzymes:  Recent Labs  01/20/14 0019 01/21/14 0532 01/21/14 1205  TROPONINI 0.66* 0.61* 0.57*   BNP:  Recent Labs  01/20/14 1710  PROBNP 2972.0*   Fasting Lipid Panel: No results found for this basename: CHOL, HDL, LDLCALC, TRIG, CHOLHDL, LDLDIRECT,  in the last 72 hours Thyroid Function Tests: No results found for this basename: TSH, T4TOTAL, FREET3, T3FREE,  THYROIDAB,  in the last 72 hours   Radiology/Studies:  Dg Chest 2 View  01/20/2014   CLINICAL DATA:  Shortness of breath.  EXAM: CHEST  2 VIEW  COMPARISON:  Chest x-ray 09/03/2013.  FINDINGS: Mediastinum and hilar structures normal. Prior CABG. Heart size and pulmonary vascularity normal. No pleural effusion or pneumothorax. Degenerative changes thoracic spine.  IMPRESSION: 1.  Prior CABG.  2. No acute cardiopulmonary disease.   Electronically Signed   By: Marcello Moores  Register   On: 01/20/2014 19:03   Ecg: NSR with ST-T wave changes c/w inferolateral ischemia.  Nuclear perfusion study 01/13/2014:  Impression  Exercise Capacity: Lexiscan with no exercise.  BP Response: Normal blood pressure response.  Clinical Symptoms: Shortness of breath  ECG Impression: No significant ST segment change suggestive of ischemia.  Comparison with Prior Nuclear Study: No images to compare  Overall Impression: High risk stress nuclear study with large fixed inferior/inferoseptal scar, no ischemia.  LV Ejection Fraction: 19%. LV Wall Motion: There is inferior wall akinesis. Severe decreased left ventricular function.  Echo: 01/21/14:Study Conclusions  - Left ventricle: The cavity size was normal. Systolic function was moderately to severely reduced. The estimated ejection fraction was in the range of 30% to 35%. Features are consistent with a pseudonormal left ventricular filling pattern, with concomitant abnormal relaxation and increased filling pressure (grade 2 diastolic dysfunction). Doppler parameters are consistent with elevated ventricular end-diastolic filling pressure. - Aortic valve: Trileaflet; severely thickened, severely calcified leaflets. Cusp separation was moderately reduced. There was moderate stenosis. Valve area (VTI): 0.7 cm^2. Valve area (Vmax): 0.72 cm^2. - Aortic root: The aortic root was normal in size. - Mitral valve: Structurally normal valve. - Left atrium: The atrium was mildly  dilated. - Right ventricle: Systolic function was moderately reduced. - Tricuspid valve: Structurally normal valve. - Pulmonary arteries: Systolic pressure was within the normal range. - Inferior vena cava: The vessel was normal in size.  Impressions:  - Compared to the prior study on September 11, 2013, the left ventricular systolic function is now decreased estimated at 30-35%, previously 55%. There are new wall motion abnormalities in the inferior and inferolateral walls. Elevated filling pressures.   Left hand Right Heart Catheterization with Coronary and Bypass Graft Angiography Report  Ricky Morales  79 y.o.  male  September 02, 1934  Procedure Date: 01/21/2014  Referring Physician: Martin[place], M.D.  Primary Cardiologist: [place], M.D.  INDICATIONS: Heart failure and elevated cardiac markers in a patient with remote history of coronary bypass grafting and recent reduction in LV systolic function.  PROCEDURE: 1. Left heart catheterization (we were unable to cross the aortic valve); 2. Right heart catheterization; 3. Coronary angiography; 4. Bypass graft angiography including left internal mammary  CONSENT:  The risks, benefits, and details of the procedure were explained in detail to the patient. Risks including death, stroke, heart attack, kidney injury, allergy, limb ischemia, bleeding and radiation injury were discussed. The patient verbalized understanding and wanted to proceed. Informed written consent was obtained.  PROCEDURE TECHNIQUE: After Xylocaine anesthesia a 5 French Slender sheath was placed in the left radial artery with an angiocath and the modified Seldinger technique. A 21-gauge Angiocath in the right antecubital vein was exchanged for a 5 French slender sheath for the purpose of right heart catheterization. Right heart hemodynamic recordings were made with a 5 French balloon tip catheter. Hemodynamic recordings were performed in each chamber. Coronary capillary wedge pressure was  performed. A main pulmonary artery O2 saturation was obtained. Coronary angiography was done using a 5 F JR 4, JL4, JL 3.5 cm, and IMA catheter catheter. Left ventriculography was not done because of inability to cross the aortic valve. Both left Judkins catheters, the right Judkins catheter and the internal mammary catheter were used but were unable to help [place] cross the valve. After significant attempts to cross the valve we terminated efforts.  The procedure was complicated by calcified aorta and difficulty with  CONTRAST: Total of 110 cc.  COMPLICATIONS: None  HEMODYNAMICS: Aortic pressure 137/68 mmHg; LV pressure not obtained; LVEDP not obtained; RA mean 16 mm mercury; RV 55/17 mmHg; PA 55/25 mmHg; PCWP(mean) 25 mm mercury; Cardiac Output 5.41 L per minute; AV gradient unable to cross the aortic valve  ANGIOGRAPHIC DATA: The left main coronary artery is obstructed 50% in the ostium and 60% distally.  The left anterior descending artery is totally occluded in the midsegment. The septal perforators supply collaterals to the distal RCA.  The left circumflex artery is functionally occluded with each significant marginal being 100% obstructed in the mid vessel  the.  The right coronary artery is totally occluded proximally.  BYPASS GRAFT ANGIOGRAPHY: Saphenous vein graft to the right coronary: Totally occluded, recent.  Saphenous vein graft to the obtuse marginal: Totally occluded  Saphenous vein graft to the diagonal: Totally occluded  Sequential LIMA to the second diagonal and LAD: Widely patent. The distal LAD/diagonal supplies faint collaterals to the circumflex and PDA  LEFT VENTRICULOGRAM: Left ventricular angiogram was not done. We will unable to cross the aortic valve. Multiple attempts and wires were used to cross. This had more to do with the approach from the radial van the significance of the aortic valve stenosis.  IMPRESSIONS: 1. Bypass graft occlusive disease with total occlusion of the  SVG to the RCA, SVG to the circumflex, and SVG to the diagonal.  2. Patent sequential left internal mammary graft to the diagonal and distal LAD.  3. Total occlusion of the LAD (MID), circumflex (total occlusion of all significant marginal branches), and RCA (Mid). The distal circumflex and RCA are filled faintly by collaterals.  4. Known significant left ventricular systolic dysfunction with the most recent echo suggesting an EF of 30%  5. Moderate aortic stenosis  RECOMMENDATION: The patient will need to have a viability study performed and if there is significant stunned/hibernating muscle, consideration of surgical revascularization could be discussed.  Heart failure management to include beta blocker/ACE/diuretics.  Consideration of AICD/resynch(if appropriate)   PHYSICAL EXAM General: Well developed, well nourished, in no acute distress. Head: Normocephalic, atraumatic, sclera non-icteric, oropharynx is clear Neck: Negative for carotid bruits. JVD not elevated. No adenopathy Lungs: Clear bilaterally to auscultation without wheezes, rales, or rhonchi. Breathing is unlabored. Heart: RRR S1 S2 with grade 2/6 systolic murmur in the RUSB. No gallop.  Abdomen: Soft, non-tender, non-distended with normoactive bowel sounds. No hepatomegaly. No rebound/guarding. No obvious abdominal masses. Msk:  Strength and tone appears normal for age. Extremities: No clubbing, cyanosis or edema.  Distal pedal pulses are 2+ and equal bilaterally. Neuro: Alert and oriented X 3. Moves all extremities spontaneously. Psych:  Responds to questions appropriately with a normal affect.  ASSESSMENT AND PLAN: 1. NSTEMI. Progressive CAD with  graft failure. Known CAD s/p CABG by Dr. Roxan Hockey in 2003 with LIMA to D2 and LAD, SVG to D1, SVG to OM2, SVG to PDA. Inferolateral ST-T changes. Cath shows occlusion of all SVGs. LIMA graft is patent. Native RCA and LCx occluded as well with faint collaterals. Initially will  maximize medical therapy. May consider viability study at some point ? Cardiac MRI to assess viability but it appears that repeat revascularization will be limited by poor targets.   2. Moderate Aortic stenosis.  3. Acute on chronic Systolic dysfunction. Need to optimize medical therapy with ACEi, Coreg, and diuretics. May be a candidate for aldactone if renal function remains stable. Will need to reassess LV function in 3 months on optimal therapy. If EF remains <35% may need to consider for ICD.  4. CKD stage 3. Creatinine stable post cath. Resume ACEi.   5. IDDM with CKD. Metformin on hold. SSI.   6. HTN- BP elevated this am. Will start amlodipine.  7. Hyperlipidemia. On lipitor.  Will transfer to telemetry today.   Present on Admission:  . ACS (acute coronary syndrome) . ASHD/CABG . Hypertension . Hyperlipidemia . T2 NIDDM w/ CKD . Aortic stenosis  Signed, Maymuna Detzel M Martin[place], [place] 01/22/2014 1:47 PM

## 2014-01-23 ENCOUNTER — Other Ambulatory Visit: Payer: Self-pay | Admitting: Physician Assistant

## 2014-01-23 ENCOUNTER — Ambulatory Visit: Payer: Medicare Other | Admitting: Internal Medicine

## 2014-01-23 DIAGNOSIS — N183 Chronic kidney disease, stage 3 unspecified: Secondary | ICD-10-CM

## 2014-01-23 LAB — BASIC METABOLIC PANEL
BUN: 21 mg/dL (ref 6–23)
CHLORIDE: 102 meq/L (ref 96–112)
CO2: 23 meq/L (ref 19–32)
Calcium: 9 mg/dL (ref 8.4–10.5)
Creatinine, Ser: 1.42 mg/dL — ABNORMAL HIGH (ref 0.50–1.35)
GFR calc non Af Amer: 46 mL/min — ABNORMAL LOW (ref >90)
GFR, EST AFRICAN AMERICAN: 53 mL/min — AB (ref >90)
Glucose, Bld: 197 mg/dL — ABNORMAL HIGH (ref 70–99)
POTASSIUM: 4.5 meq/L (ref 3.7–5.3)
SODIUM: 137 meq/L (ref 137–147)

## 2014-01-23 LAB — GLUCOSE, CAPILLARY
GLUCOSE-CAPILLARY: 188 mg/dL — AB (ref 70–99)
Glucose-Capillary: 236 mg/dL — ABNORMAL HIGH (ref 70–99)

## 2014-01-23 MED ORDER — METFORMIN HCL 500 MG PO TABS: 500.0000 mg | ORAL_TABLET | Freq: Two times a day (BID) | ORAL | Status: DC

## 2014-01-23 MED ORDER — CLOPIDOGREL BISULFATE 75 MG PO TABS: 75.0000 mg | ORAL_TABLET | Freq: Every day | ORAL | Status: DC

## 2014-01-23 MED ORDER — FUROSEMIDE 20 MG PO TABS: 20.0000 mg | ORAL_TABLET | Freq: Every day | ORAL | Status: DC

## 2014-01-23 MED ORDER — AMLODIPINE BESYLATE 5 MG PO TABS: 5.0000 mg | ORAL_TABLET | Freq: Every day | ORAL | Status: DC

## 2014-01-23 MED ORDER — CARVEDILOL 25 MG PO TABS: 25.0000 mg | ORAL_TABLET | Freq: Two times a day (BID) | ORAL | Status: DC

## 2014-01-23 MED ORDER — CLOPIDOGREL BISULFATE 75 MG PO TABS
75.0000 mg | ORAL_TABLET | Freq: Every day | ORAL | Status: DC
  Administered 2014-01-23: 75 mg via ORAL
  Filled 2014-01-23 (×3): qty 1

## 2014-01-23 NOTE — Progress Notes (Signed)
Subjective: No SOB, orthopnea, PND or chest pain  Objective: Vital signs in last 24 hours: Temp:  [97.4 F (36.3 C)-98.7 F (37.1 C)] 97.8 F (36.6 C) (05/29 0603) Pulse Rate:  [60-63] 61 (05/29 0603) Resp:  [14-23] 21 (05/29 0603) BP: (100-142)/(48-77) 120/60 mmHg (05/29 0603) SpO2:  [94 %-98 %] 95 % (05/29 0603) Weight:  [208 lb 8 oz (94.575 kg)-211 lb 6.7 oz (95.9 kg)] 208 lb 8 oz (94.575 kg) (05/29 0603) Last BM Date: 01/22/14  Intake/Output from previous day: 05/28 0701 - 05/29 0700 In: 460.5 [P.O.:450; I.V.:10.5] Out: 1175 [Urine:1175] Intake/Output this shift: Total I/O In: 240 [P.O.:240] Out: -   Medications Current Facility-Administered Medications  Medication Dose Route Frequency Provider Last Rate Last Dose  . amLODipine (NORVASC) tablet 5 mg  5 mg Oral Daily Peter M Martinique, MD   5 mg at 01/22/14 9678  . aspirin EC tablet 81 mg  81 mg Oral Daily Alwyn Pea, MD   81 mg at 01/22/14 9381  . atorvastatin (LIPITOR) tablet 80 mg  80 mg Oral Daily Alwyn Pea, MD   80 mg at 01/22/14 1702  . carvedilol (COREG) tablet 25 mg  25 mg Oral BID WC Peter M Martinique, MD   25 mg at 01/22/14 1702  . enalapril (VASOTEC) tablet 20 mg  20 mg Oral Daily Peter M Martinique, MD   20 mg at 01/22/14 2149  . furosemide (LASIX) tablet 40 mg  40 mg Oral Daily Peter M Martinique, MD   40 mg at 01/22/14 1702  . heparin injection 5,000 Units  5,000 Units Subcutaneous 3 times per day Peter M Martinique, MD   5,000 Units at 01/23/14 0531  . insulin aspart (novoLOG) injection 0-9 Units  0-9 Units Subcutaneous TID WC Alwyn Pea, MD   2 Units at 01/23/14 [id number]  . LORazepam (ATIVAN) tablet 1 mg  1 mg Oral QHS PRN Alwyn Pea, MD   1 mg at 01/22/14 2149  . multivitamin with minerals tablet 1 tablet  1 tablet Oral Daily Alwyn Pea, MD   1 tablet at 01/22/14 1025  . nitroGLYCERIN (NITROSTAT) SL tablet 0.4 mg  0.4 mg Sublingual Q5 Min x 3 PRN Alwyn Pea, MD      . pantoprazole  (PROTONIX) EC tablet 40 mg  40 mg Oral Daily Alwyn Pea, MD   40 mg at 01/22/14 8527    PE: General appearance: alert, cooperative and no distress Lungs: clear to auscultation bilaterally Heart: regular rate and rhythm and 2/6 sys MM LSB Extremities: No LEE Pulses: 2+ and symmetric; left radial cath site with no hematoma 1+ DPs Skin: warm and dry Neurologic: Grossly normal  Lab Results:   Recent Labs  01/20/14 1056 01/21/14 0411 01/22/14 0304  WBC 8.4 8.7 7.9  HGB 11.3* 10.8* 10.5*  HCT 34.0* 32.4* 31.2*  PLT 223.0 228 221   BMET  Recent Labs  01/21/14 0411 01/22/14 1615 01/23/14 0635  NA 137 136* 137  K 5.7* 4.6 4.5  CL 104 102 102  CO2 21 22 23   GLUCOSE 196* 195* 197*  BUN 34* 21 21  CREATININE 1.46* 1.35 1.42*  CALCIUM 9.4 9.2 9.0   PT/INR  Recent Labs  01/20/14 1056 01/21/14 0411  LABPROT 12.4 15.1  INR 1.1* 1.22     Assessment/Plan  1. NSTEMI. Progressive CAD with graft failure. Known CAD s/p CABG by Dr. Roxan Hockey in 2003 with LIMA to D2 and LAD, SVG to D1, SVG to OM2, SVG to  PDA. Inferolateral ST-T changes. Cath shows occlusion of all SVGs. LIMA graft is patent. Native RCA and LCx occluded as well with faint collaterals. Maximizing medical therapy. May consider viability study at some point? Cardiac MRI to assess viability but it appears that repeat revascularization will be limited by poor targets.  ASA,  Coreg 25 BID, statin  2. Moderate Aortic stenosis.   3. Acute on chronic Systolic and diastolic dysfunction.   EF 37-10%, Grade 2 diastolic dys. Echo in 3 months.  If EF remains <35% may need to consider for ICD. Net fluids: -0.7L/-1.4L.  Lasix 40mg  PO daily.   4. CKD stage 3.  Creatinine stable post cath. Back on ACEi.  5. IDDM with CKD. Metformin on hold. SSI.  6. HTN- BP elevated this am. Will start amlodipine.  7. Hyperlipidemia. On lipitor.  Disposition:  He looks like he is ready to go home today.  Follow up with Dr.  Harrington Challenger.    LOS: 3 days    Tarri Fuller PA-C 01/23/2014 9:26 AM As above, patient seen and examined. Patient denies chest pain or dyspnea. Cardiac catheterization results noted. Plan medical therapy with aspirin, Plavix and statin. Patient will be discharged today. Followup Dr. Harrington Challenger. Can consider viability study as an outpatient but per Dr. Martinique he may not benefit from revascularization based on poor targets. Continue ACE inhibitor and beta blocker for ischemic cardiomyopathy. Repeat echocardiogram in 3 months. If ejection fraction less than 35% he will need ICD. Change Lasix to 20 mg daily with an additional 20 mg daily as needed. Check potassium and renal function on Monday. He will need followup echoes in the future for aortic stenosis. > 30 min PA and physician time D2 Lelon Perla 10:19 AM

## 2014-01-23 NOTE — Discharge Summary (Signed)
Physician Discharge Summary    Cardiologist:  Harrington Challenger  Patient ID: TREMAIN RUCINSKI MRN: [id number] DOB/AGE: April 18, 1935 79 y.o.  Admit date: 01/20/2014 Discharge date: 01/23/2014  Admission Diagnoses:  NSTEMI  Discharge Diagnoses:  Active Problems:   NSTEMI   CKD stage 3   T2 NIDDM w/ CKD   Hyperlipidemia   Hypertension   ASHD/CABG   ACS (acute coronary syndrome)   Aortic stenosis   Discharged Condition: stable  Hospital Course:   Ricky Morales is a 79 y.o. male who presents for evaluation of dyspnea on exertion. He has known hx of CAD (s/p PTCA of RCA 1991; PTCA of LAD 1992) He underwent stress test then cath in 2003 which showed: LM 50%. LAD 70% mid; diffuse 50% mid; D1 70%; D2 50%; LCx 40%, OM2 90%, RCA 40 to 505; LVEF 55%. He went on to have CABG (LIMA to D2, LAD; SVG to D1; SVG to OM2; SVG to PDA). He has not followed up with Cardiology since that time until Jan 2015 of this year. He had 2D echo at that time revealing preserved LV function EF 55% with moderate aortic stenosis. He saw his PCP in march for progressive dyspnea on exertion. He had stress test done in May which was abnormal. He was planned for cath today but had abnormal kidney function and this was postponed. He comes in to ED today as his symptoms have gotten progressively worse. Patient is normally fairly active and notices dyspnea on mild exertion. He has to stop and rest to relieve his symptoms. He does not report these episodes being associated with chest pain. He has had maybe 1-2 episodes of short pressure in mid chest. This week symptoms have continued to progress and felt could not wait to be seen in follow up. In ED patient was found to have elevated BNP and troponin. Cardiology was consulted for admission.  The patient was admitted.  Troponin peaked at 0.66.  BNP was elevated at 2972.0.  He was started on ASA, Heparin gtt, BB, Statin, NTG prn.  ACE-I and metformin were held prior to heart cath.  The study revealed  occlusion of all SVGs. LIMA graft is patent. Native RCA and LCx occluded as well with faint collaterals.  Medical therapy was maximized. May consider viability study at some point?  Cardiac MRI to assess viability but it appears that repeat revascularization will be limited by poor targets. ASA, Coreg 25 BID, statin.  Plavix was also added.   amloodipine was added for bette BP control and ACE-i was restarted post cath.  He was given Lasix 40 mg PO and diuresed 1.4L.  Lasix was decreased to 20 mg daily with an extra 20 daily as needed.  Echo revealed an EF of 30-35%, grade two diastolic dysfunction, AVA 0.72cm^2(see full report below).   Metformin will be held until Sunday.  BMET on Monday June 1.  He will need another echo in three months and if EF not improved, consider for ICD.  The patient was seen by Dr. Stanford Breed who felt he was stable for DC home.    Consults: Diabetes Coor.  Significant Diagnostic Studies: CHEST 2 VIEW  COMPARISON: Chest x-ray 09/03/2013.  FINDINGS: Mediastinum and hilar structures normal. Prior CABG. Heart size and pulmonary vascularity normal. No pleural effusion or pneumothorax. Degenerative changes thoracic spine.  IMPRESSION: 1. Prior CABG. 2. No acute cardiopulmonary disease.    Echo, 01/21/14 Study Conclusions  - Left ventricle: The cavity size was normal. Systolic function was  moderately to severely reduced. The estimated ejection fraction was in the range of 30% to 35%. Features are consistent with a pseudonormal left ventricular filling pattern, with concomitant abnormal relaxation and increased filling pressure (grade 2 diastolic dysfunction). Doppler parameters are consistent with elevated ventricular end-diastolic filling pressure. - Aortic valve: Trileaflet; severely thickened, severely calcified leaflets. Cusp separation was moderately reduced. There was moderate stenosis. Valve area (VTI): 0.7 cm^2. Valve area (Vmax): 0.72 cm^2. - Aortic root:  The aortic root was normal in size. - Mitral valve: Structurally normal valve. - Left atrium: The atrium was mildly dilated. - Right ventricle: Systolic function was moderately reduced. - Tricuspid valve: Structurally normal valve. - Pulmonary arteries: Systolic pressure was within the normal range. - Inferior vena cava: The vessel was normal in size.  Impressions:  - Compared to the prior study on September 11, 2013, the left ventricular systolic function is now decreased estimated at 30-35%, previously 55%. There are new wall motion abnormalities in the inferior and inferolateral walls. Elevated filling pressures.   Left heart cath   CONTRAST: Total of 110 cc.  COMPLICATIONS: None  HEMODYNAMICS: Aortic pressure 137/68 mmHg; LV pressure not obtained; LVEDP not obtained; RA mean 16 mm mercury; RV 55/17 mmHg; PA 55/25 mmHg; PCWP(mean) 25 mm mercury; Cardiac Output 5.41 L per minute; AV gradient unable to cross the aortic valve  ANGIOGRAPHIC DATA: The left main coronary artery is obstructed 50% in the ostium and 60% distally.  The left anterior descending artery is totally occluded in the midsegment. The septal perforators supply collaterals to the distal RCA.  The left circumflex artery is functionally occluded with each significant marginal being 100% obstructed in the mid vessel the.  The right coronary artery is totally occluded proximally.  BYPASS GRAFT ANGIOGRAPHY: Saphenous vein graft to the right coronary: Totally occluded, recent.  Saphenous vein graft to the obtuse marginal: Totally occluded  Saphenous vein graft to the diagonal: Totally occluded  Sequential LIMA to the second diagonal and LAD: Widely patent. The distal LAD/diagonal supplies faint collaterals to the circumflex and PDA  LEFT VENTRICULOGRAM: Left ventricular angiogram was not done. We will unable to cross the aortic valve. Multiple attempts and wires were used to cross. This had more to do with the approach from  the radial van the significance of the aortic valve stenosis.  IMPRESSIONS: 1. Bypass graft occlusive disease with total occlusion of the SVG to the RCA, SVG to the circumflex, and SVG to the diagonal.  2. Patent sequential left internal mammary graft to the diagonal and distal LAD.  3. Total occlusion of the LAD (MID), circumflex (total occlusion of all significant marginal branches), and RCA (Mid). The distal circumflex and RCA are filled faintly by collaterals.  4. Known significant left ventricular systolic dysfunction with the most recent echo suggesting an EF of 30%  5. Moderate aortic stenosis  RECOMMENDATION: The patient will need to have a viability study performed and if there is significant stunned/hibernating muscle, consideration of surgical revascularization could be discussed.  Heart failure management to include beta blocker/ACE/diuretics.  Consideration of AICD/resynch(if appropriate)   Treatments:  See above  Discharge Exam: Blood pressure 120/60, pulse 61, temperature 97.8 F (36.6 C), temperature source Oral, resp. rate 21, height 5\' 10"  (1.778 m), weight 208 lb 8 oz (94.575 kg), SpO2 95.00%.   Disposition: 01-Home or Self Care  Discharge Instructions   Diet - low sodium heart healthy    Complete by:  As directed  Discharge instructions    Complete by:  As directed   Monitor your weight daily.  If you gain 2 pounds in 24 hours or 5 pounds in a week, take an extra 20mg  of lasix until your weight decreases to baseline.  If it does not after three days, call the office.     Increase activity slowly    Complete by:  As directed             Medication List    STOP taking these medications       atenolol 100 MG tablet  Commonly known as:  TENORMIN     ibuprofen 200 MG tablet  Commonly known as:  ADVIL,MOTRIN      TAKE these medications       ALEVE 220 MG tablet  Generic drug:  naproxen sodium  Take 400 mg by mouth daily as needed (pain).      amLODipine 5 MG tablet  Commonly known as:  NORVASC  Take 1 tablet (5 mg total) by mouth daily.     aspirin EC 81 MG tablet  Take 81 mg by mouth daily.     atorvastatin 80 MG tablet  Commonly known as:  LIPITOR  Take 80 mg by mouth daily.     carvedilol 25 MG tablet  Commonly known as:  COREG  Take 1 tablet (25 mg total) by mouth 2 (two) times daily with a meal.     clopidogrel 75 MG tablet  Commonly known as:  PLAVIX  Take 1 tablet (75 mg total) by mouth daily with breakfast.     enalapril 20 MG tablet  Commonly known as:  VASOTEC  Take 20 mg by mouth daily.     Fish Oil 1000 MG Caps  Take 1,000 mg by mouth daily.     furosemide 20 MG tablet  Commonly known as:  LASIX  Take 1 tablet (20 mg total) by mouth daily.  Start taking on:  01/24/2014     glipiZIDE 5 MG tablet  Commonly known as:  GLUCOTROL  Take 5 mg by mouth 2 (two) times daily before a meal.     LORazepam 2 MG tablet  Commonly known as:  ATIVAN  Take 2 mg by mouth at bedtime as needed (sleep).     Magnesium 250 MG Tabs  Take 250 mg by mouth 2 (two) times daily.     metFORMIN 500 MG tablet  Commonly known as:  GLUCOPHAGE  Take 1 tablet (500 mg total) by mouth 2 (two) times daily with a meal.     multivitamin with minerals Tabs tablet  Take 1 tablet by mouth daily.     nitroGLYCERIN 0.4 MG SL tablet  Commonly known as:  NITROSTAT  Place 1 tablet (0.4 mg total) under the tongue every 5 (five) minutes as needed for chest pain.     omeprazole 40 MG capsule  Commonly known as:  PRILOSEC  Take 40 mg by mouth daily as needed (acid reflux).     VITAMIN D PO  Take 1,500 Units by mouth daily.        Greater than 30 minutes was spent completing the patient's discharge.   Signed: Tarri Fuller 01/23/2014, 10:37 AM

## 2014-01-23 NOTE — Discharge Summary (Signed)
See progress notes Ricky Morales Ricky Morales  

## 2014-01-23 NOTE — Progress Notes (Signed)
DC IV, DC Tele, DC Home. Discharge instructions and home medications discussed with patient and patient's wife. Patient and wife denied any questions or concerns at this time. Patient leaving unit via wheelchair and appears in no acute distress. 

## 2014-01-23 NOTE — Discharge Instructions (Signed)
Go to the lab on Monday, June 1 so we can check your kidney function with a Basic Metabolic Panel.  Restart Metformin on Sunday, May 31.

## 2014-01-24 ENCOUNTER — Other Ambulatory Visit: Payer: Self-pay | Admitting: Internal Medicine

## 2014-01-26 ENCOUNTER — Other Ambulatory Visit: Payer: Medicare Other

## 2014-01-26 DIAGNOSIS — N183 Chronic kidney disease, stage 3 unspecified: Secondary | ICD-10-CM

## 2014-01-26 LAB — BASIC METABOLIC PANEL
BUN: 34 mg/dL — AB (ref 6–23)
CALCIUM: 9.6 mg/dL (ref 8.4–10.5)
CO2: 23 mEq/L (ref 19–32)
Chloride: 101 mEq/L (ref 96–112)
Creat: 1.53 mg/dL — ABNORMAL HIGH (ref 0.50–1.35)
Glucose, Bld: 253 mg/dL — ABNORMAL HIGH (ref 70–99)
Potassium: 5.1 mEq/L (ref 3.5–5.3)
Sodium: 132 mEq/L — ABNORMAL LOW (ref 135–145)

## 2014-01-30 ENCOUNTER — Encounter: Payer: Self-pay | Admitting: Physician Assistant

## 2014-01-30 ENCOUNTER — Ambulatory Visit (INDEPENDENT_AMBULATORY_CARE_PROVIDER_SITE_OTHER): Payer: Medicare Other | Admitting: Physician Assistant

## 2014-01-30 VITALS — BP 120/60 | HR 60 | Temp 98.1°F | Resp 16 | Wt 211.0 lb

## 2014-01-30 DIAGNOSIS — I10 Essential (primary) hypertension: Secondary | ICD-10-CM

## 2014-01-30 DIAGNOSIS — E1129 Type 2 diabetes mellitus with other diabetic kidney complication: Secondary | ICD-10-CM

## 2014-01-30 DIAGNOSIS — E785 Hyperlipidemia, unspecified: Secondary | ICD-10-CM

## 2014-01-30 NOTE — Patient Instructions (Signed)
Do the following things EVERYDAY: 1) Weigh yourself in the morning before breakfast. Write it down and keep it in a log. 2) Take your medicines as prescribed 3) Eat low salt foods-Limit salt (sodium) to 2000 mg per day.  4) Stay as active as you can everyday 5) Limit all fluids for the day to less than 2 liters     Bad carbs also include fruit juice, alcohol, and sweet tea. These are empty calories that do not signal to your brain that you are full.   Please remember the good carbs are still carbs which convert into sugar. So please measure them out no more than 1/2-1 cup of rice, oatmeal, pasta, and beans.  Veggies are however free foods! Pile them on.   I like lean protein at every meal such as chicken, [place], pork chops, cottage cheese, etc. Just do not fry these meats and please center your meal around vegetable, the meats should be a side dish.   No all fruit is created equal. Please see the list below, the fruit at the bottom is higher in sugars than the fruit at the top

## 2014-01-30 NOTE — Progress Notes (Signed)
Assessment and Plan:  Hypertension: Continue medication, monitor blood pressure at home. Continue DASH diet. Cholesterol: Continue diet and exercise.  Diabetes-Continue diet and exercise.  Vitamin D Def- check level and continue medications.  IDCMP- optimized on meds and following up with cardiologist- weight self daily We will hold off on labs, patient states he just had lab work done that the cardiologist and would prefer to wait.   Continue diet and meds as discussed. Further disposition pending results of labs. Discussed med's effects and SE's.    HPI 79 y.o. male  presents for 3 month follow up with hypertension, hyperlipidemia, diabetes and vitamin D. His blood pressure has been controlled at home, today their BP is BP: 120/60 mmHg He does not workout but stays active. He denies chest pain, dizziness.  He is on cholesterol medication and denies myalgias. His cholesterol is at goal. The cholesterol last visit was:   Lab Results  Component Value Date   CHOL 146 10/28/2013   HDL 38* 10/28/2013   LDLCALC 79 10/28/2013   TRIG 145 10/28/2013   CHOLHDL 3.8 10/28/2013   He has been working on diet and exercise for Diabetes, and denies paresthesia of the feet, polydipsia and polyuria. Last A1C in the office was:  Lab Results  Component Value Date   HGBA1C 9.5* 10/28/2013   Patient is on Vitamin D supplement.   Patient was having fatigue and SOB, he was sent back to cardiology and found to have some of his grafts and native grafts were closed and EF was 19%--> 30%, after optimization of medication they will recheck his EF and decide if he needs an AICD. He states that he is currently feeling much better, less SOB.   Current Medications:  Current Outpatient Prescriptions on File Prior to Visit  Medication Sig Dispense Refill  . ACCU-CHEK SMARTVIEW test strip Use as directed  300 each  2  . amLODipine (NORVASC) 5 MG tablet Take 1 tablet (5 mg total) by mouth daily.  30 tablet  5  . aspirin EC 81  MG tablet Take 81 mg by mouth daily.      Marland Kitchen atorvastatin (LIPITOR) 80 MG tablet Take 80 mg by mouth daily.       . carvedilol (COREG) 25 MG tablet Take 1 tablet (25 mg total) by mouth 2 (two) times daily with a meal.  60 tablet  5  . Cholecalciferol (VITAMIN D PO) Take 1,500 Units by mouth daily.       . clopidogrel (PLAVIX) 75 MG tablet Take 1 tablet (75 mg total) by mouth daily with breakfast.  30 tablet  5  . enalapril (VASOTEC) 20 MG tablet Take 20 mg by mouth daily.      . furosemide (LASIX) 20 MG tablet Take 1 tablet (20 mg total) by mouth daily.  60 tablet  5  . glipiZIDE (GLUCOTROL) 5 MG tablet Take 5 mg by mouth 2 (two) times daily before a meal.      . LORazepam (ATIVAN) 2 MG tablet Take 2 mg by mouth at bedtime as needed (sleep).      . Magnesium 250 MG TABS Take 250 mg by mouth 2 (two) times daily.      . metFORMIN (GLUCOPHAGE) 500 MG tablet Take 1 tablet (500 mg total) by mouth 2 (two) times daily with a meal.      . Multiple Vitamin (MULTIVITAMIN WITH MINERALS) TABS tablet Take 1 tablet by mouth daily.      . naproxen sodium (  ALEVE) 220 MG tablet Take 400 mg by mouth daily as needed (pain).      . nitroGLYCERIN (NITROSTAT) 0.4 MG SL tablet Place 1 tablet (0.4 mg total) under the tongue every 5 (five) minutes as needed for chest pain.  100 tablet  3  . Omega-3 Fatty Acids (FISH OIL) 1000 MG CAPS Take 1,000 mg by mouth daily.       Marland Kitchen omeprazole (PRILOSEC) 40 MG capsule Take 40 mg by mouth daily as needed (acid reflux).       No current facility-administered medications on file prior to visit.   Medical History:  Past Medical History  Diagnosis Date  . Diabetes mellitus without complication   . Hyperlipidemia   . Hypertension   . Vitamin D deficiency   . GERD (gastroesophageal reflux disease)   . Type II or unspecified type diabetes mellitus with renal manifestations, not stated as uncontrolled    Allergies:  Allergies  Allergen Reactions  . Actos [Pioglitazone] Other (See  Comments)    Excessive weight gain  . Percocet [Oxycodone-Acetaminophen] Other (See Comments)    hallucinations     Review of Systems: [X]  = complains of  [ ]  = denies  General: Fatigue [ ]  Fever [ ]  Chills [ ]  Weakness [ ]   Insomnia [ ]  Eyes: Redness [ ]  Blurred vision [ ]  Diplopia [ ]   ENT: Congestion [ ]  Sinus Pain [ ]  Post Nasal Drip [ ]  Sore Throat [ ]  Earache [ ]   Cardiac: Chest pain/pressure [ ]  SOB Valu.Nieves ] Orthopnea [ ]   Palpitations [ ]   Paroxysmal nocturnal dyspnea[ ]  Claudication [ ]  Edema [ ]   Pulmonary: Cough [ ]  Wheezing[ ]   SOB [ ]   Snoring [ ]   GI: Nausea [ ]  Vomiting[ ]  Dysphagia[ ]  Heartburn[ ]  Abdominal pain [ ]  Constipation [ ] ; Diarrhea [ ] ; BRBPR [ ]  Melena[ ]  GU: Hematuria[ ]  Dysuria [ ]  Nocturia[ ]  Urgency [ ]   Hesitancy [ ]  Discharge [ ]  Neuro: Headaches[ ]  Vertigo[ ]  Paresthesias[ ]  Spasm [ ]  Speech changes [ ]  Incoordination [ ]   Ortho: Arthritis [ ]  Joint pain [ ]  Muscle pain [ ]  Joint swelling [ ]  Back Pain [ ]  Skin:  Rash [ ]   Pruritis [ ]  Change in skin lesion [ ]   Psych: Depression[ ]  Anxiety[ ]  Confusion [ ]  Memory loss [ ]   Heme/Lypmh: Bleeding [ ]  Bruising [ ]  Enlarged lymph nodes [ ]   Endocrine: Visual blurring [ ]  Paresthesia [ ]  Polyuria [ ]  Polydypsea [ ]    Heat/cold intolerance [ ]  Hypoglycemia [ ]   Family history- Review and unchanged Social history- Review and unchanged Physical Exam: BP 120/60  Pulse 60  Temp(Src) 98.1 F (36.7 C)  Resp 16  Wt 211 lb (95.709 kg) Wt Readings from Last 3 Encounters:  01/30/14 211 lb (95.709 kg)  01/23/14 208 lb 8 oz (94.575 kg)  01/23/14 208 lb 8 oz (94.575 kg)   General Appearance: Well nourished, in no apparent distress. Eyes: PERRLA, EOMs, conjunctiva no swelling or erythema Sinuses: No Frontal/maxillary tenderness ENT/Mouth: Ext aud canals clear, TMs without erythema, bulging. No erythema, swelling, or exudate on post pharynx.  Tonsils not swollen or erythematous. Hearing normal.  Neck: Supple,  thyroid normal.  Respiratory: Respiratory effort normal, BS equal bilaterally without rales, rhonchi, wheezing or stridor.  Cardio: RRR with 4/6 holosystolic murmur. Brisk peripheral pulses with 2+ edema.  Abdomen: Soft, + BS.  Non tender, no guarding, rebound, hernias, masses. Lymphatics:  Non tender without lymphadenopathy.  Musculoskeletal: Full ROM, 5/5 strength, normal gait.  Skin: Warm, dry without rashes, lesions, ecchymosis.  Neuro: Cranial nerves intact. No cerebellar symptoms. Sensation intact.  Psych: Awake and oriented X 3, normal affect, Insight and Judgment appropriate.    Vicie Mutters 11:06 AM

## 2014-02-02 ENCOUNTER — Encounter: Payer: Self-pay | Admitting: Internal Medicine

## 2014-02-02 ENCOUNTER — Ambulatory Visit (INDEPENDENT_AMBULATORY_CARE_PROVIDER_SITE_OTHER): Payer: Medicare Other | Admitting: Internal Medicine

## 2014-02-02 VITALS — BP 91/54 | HR 68 | Ht 70.0 in | Wt 212.0 lb

## 2014-02-02 DIAGNOSIS — I10 Essential (primary) hypertension: Secondary | ICD-10-CM

## 2014-02-02 LAB — BASIC METABOLIC PANEL
BUN: 44 mg/dL — ABNORMAL HIGH (ref 6–23)
CALCIUM: 9.3 mg/dL (ref 8.4–10.5)
CO2: 25 meq/L (ref 19–32)
CREATININE: 1.7 mg/dL — AB (ref 0.4–1.5)
Chloride: 101 mEq/L (ref 96–112)
GFR: 41.72 mL/min — ABNORMAL LOW (ref >60.00)
Glucose, Bld: 222 mg/dL — ABNORMAL HIGH (ref 70–99)
Potassium: 4.9 mEq/L (ref 3.5–5.1)
SODIUM: 134 meq/L — AB (ref 135–145)

## 2014-02-02 LAB — BRAIN NATRIURETIC PEPTIDE: Pro B Natriuretic peptide (BNP): 431 pg/mL — ABNORMAL HIGH (ref 0.0–100.0)

## 2014-02-02 NOTE — Progress Notes (Signed)
HPI Patinet is a 79 yo who has a history of CAD (s/p PTCA of RCA 1991; PTCA of LAD 1992)  He underwent stress test then cath cath in 2003 Showed:  LM 50%. LAD 70% mid; diffuse 50% mid; D1 70%; D2 50%; LCx 40%, OM2 90% RCA 40 to 505; LVEF 55%  He went on to have CABG (LIMA to D2, LAD; SVG to D1; SVG to OM2; SVG to PDA) I saw the patient in January  It was his first time since surgery for f/u  He complained of some SOB I recomm a stress test.  He did not have immediately  Eventually had   After this had echo done REcomm L heart cath.   He complained of SOB     Allergies  Allergen Reactions  . Actos [Pioglitazone] Other (See Comments)    Excessive weight gain  . Percocet [Oxycodone-Acetaminophen] Other (See Comments)    hallucinations    Current Outpatient Prescriptions  Medication Sig Dispense Refill  . ACCU-CHEK SMARTVIEW test strip Use as directed  300 each  2  . amLODipine (NORVASC) 5 MG tablet Take 1 tablet (5 mg total) by mouth daily.  30 tablet  5  . aspirin EC 81 MG tablet Take 81 mg by mouth daily.      Marland Kitchen atorvastatin (LIPITOR) 80 MG tablet Take 80 mg by mouth daily.       . carvedilol (COREG) 25 MG tablet Take 1 tablet (25 mg total) by mouth 2 (two) times daily with a meal.  60 tablet  5  . Cholecalciferol (VITAMIN D PO) Take 1,500 Units by mouth daily.       . clopidogrel (PLAVIX) 75 MG tablet Take 1 tablet (75 mg total) by mouth daily with breakfast.  30 tablet  5  . enalapril (VASOTEC) 20 MG tablet Take 20 mg by mouth daily.      . furosemide (LASIX) 20 MG tablet Take 1 tablet (20 mg total) by mouth daily.  60 tablet  5  . glipiZIDE (GLUCOTROL) 5 MG tablet Take 5 mg by mouth 2 (two) times daily before a meal.      . LORazepam (ATIVAN) 2 MG tablet Take 2 mg by mouth at bedtime as needed (sleep).      . Magnesium 250 MG TABS Take 250 mg by mouth 2 (two) times daily.      . metFORMIN (GLUCOPHAGE) 500 MG tablet Take 1 tablet (500 mg total) by mouth 2 (two) times daily with a  meal.      . Multiple Vitamin (MULTIVITAMIN WITH MINERALS) TABS tablet Take 1 tablet by mouth daily.      . naproxen sodium (ALEVE) 220 MG tablet Take 400 mg by mouth daily as needed (pain).      . nitroGLYCERIN (NITROSTAT) 0.4 MG SL tablet Place 1 tablet (0.4 mg total) under the tongue every 5 (five) minutes as needed for chest pain.  100 tablet  3  . Omega-3 Fatty Acids (FISH OIL) 1000 MG CAPS Take 1,000 mg by mouth daily.       Marland Kitchen omeprazole (PRILOSEC) 40 MG capsule Take 40 mg by mouth daily as needed (acid reflux).       No current facility-administered medications for this visit.    Past Medical History  Diagnosis Date  . Diabetes mellitus without complication   . Hyperlipidemia   . Hypertension   . Vitamin D deficiency   . GERD (gastroesophageal reflux disease)   . Type II or  unspecified type diabetes mellitus with renal manifestations, not stated as uncontrolled     Past Surgical History  Procedure Laterality Date  . Appendectomy  1955  . Coronary artery bypass graft  2003  . Cardiac catheterization  1991, 1992, 2003  . Eye surgery  12/2006    Right CE/IOL    Family History  Problem Relation Age of Onset  . Heart disease Mother   . Diabetes Mother   . Cancer Father     History   Social History  . Marital Status: Married    Spouse Name: N/A    Number of Children: N/A  . Years of Education: N/A   Occupational History  . Not on file.   Social History Main Topics  . Smoking status: Former Smoker -- 4 years    Types: Cigarettes    Quit date: 08/28/1968  . Smokeless tobacco: Not on file  . Alcohol Use: Yes     Comment: occasionally  . Drug Use: Not on file  . Sexual Activity: Not on file   Other Topics Concern  . Not on file   Social History Narrative  . No narrative on file    Review of Systems:  All systems reviewed.  They are negative to the above problem except as previously stated.  Vital Signs: BP 91/54  Pulse 68  Ht 5\' 10"  (1.778 m)  Wt 212  lb (96.163 kg)  BMI 30.42 kg/m2  Physical Exam Patient is in NAD HEENT:  Normocephalic, atraumatic. EOMI, PERRLA.  Neck: JVP is normal.  No bruits.  Lungs: clear to auscultation. No rales no wheezes.  Heart: Regular rate and rhythm. Normal S1, S2. No S3.   Gr III/VI systolic murmur LSB to base.  PMI not displaced.  Abdomen:  Supple, nontender. Normal bowel sounds. No masses. No hepatomegaly.  Extremities:   Good distal pulses throughout. No lower extremity edema.  Musculoskeletal :moving all extremities.  Neuro:   alert and oriented x3.  CN II-XII grossly intact.  EKG  SR 79 bpm  (09/04/13)  Assessment and Plan:  1.  CAD Patinet with severe diffuse disease.  Plan for trial of medical Rx.   Patinet says he is doing much better.  VOlume status looks OK  I would keep on same regimen.  Watch salt  2.  ICM  As noted above  3.  HL  Keep on statin.    4.  Aortic stenosis.  Moderate.  Will need to follow    4.  DM  Continue meds.

## 2014-02-02 NOTE — Patient Instructions (Addendum)
Your physician recommends that you continue on your current medications as directed. Please refer to the Current Medication list given to you today.  Your physician recommends that you return for lab work in: TODAY. (BMET, BNP)  Your physician recommends that you schedule a follow-up appointment in: AUGUST, 2015 [place].

## 2014-02-03 ENCOUNTER — Telehealth: Payer: Self-pay | Admitting: Internal Medicine

## 2014-02-03 NOTE — Telephone Encounter (Signed)
New message      Four plus pedal edema in left ankle----pt is on 20mg  of lasix.  Please call pt at his home [phone] and advise

## 2014-02-03 NOTE — Telephone Encounter (Signed)
Called patient's home, spoke with Pamala Hurry, home care nurse. She is there for routine visit. Found patient has 4+ pitting edema to L ankle, 2+ to left leg/calf. Per patient and his wife this is a new finding. No pain or redness to LLE No edema to RLE Taking lasix 20 mg daily. No SOB, lungs clear He was instructed to elevate legs. Will send to Dr. Harrington Challenger to advise.

## 2014-02-06 NOTE — Telephone Encounter (Signed)
Called patinet  Spoke to wife  Legs better  She attributes to socks that were too tight.

## 2014-03-03 ENCOUNTER — Telehealth: Payer: Self-pay | Admitting: *Deleted

## 2014-03-03 DIAGNOSIS — I10 Essential (primary) hypertension: Secondary | ICD-10-CM

## 2014-03-03 NOTE — Telephone Encounter (Signed)
Repeat labs in early August (BMET) ----- Message ----- From: Fay Records, MD    Spoke with pt. He plans to move up his PCP appt to early August, and so will have labs drawn at that visit. If he is unable to do so, he will call back to arrange date to come here for lab.

## 2014-03-19 ENCOUNTER — Ambulatory Visit (INDEPENDENT_AMBULATORY_CARE_PROVIDER_SITE_OTHER): Payer: Medicare Other | Admitting: Internal Medicine

## 2014-03-19 ENCOUNTER — Encounter: Payer: Self-pay | Admitting: Internal Medicine

## 2014-03-19 VITALS — BP 86/50 | HR 62 | Temp 98.0°F | Resp 18 | Ht 70.0 in | Wt 200.0 lb

## 2014-03-19 DIAGNOSIS — E559 Vitamin D deficiency, unspecified: Secondary | ICD-10-CM

## 2014-03-19 DIAGNOSIS — E1129 Type 2 diabetes mellitus with other diabetic kidney complication: Secondary | ICD-10-CM

## 2014-03-19 DIAGNOSIS — I150 Renovascular hypertension: Secondary | ICD-10-CM

## 2014-03-19 DIAGNOSIS — Z79899 Other long term (current) drug therapy: Secondary | ICD-10-CM

## 2014-03-19 DIAGNOSIS — E785 Hyperlipidemia, unspecified: Secondary | ICD-10-CM

## 2014-03-19 LAB — CBC WITH DIFFERENTIAL/PLATELET
BASOS PCT: 1 % (ref 0–1)
Basophils Absolute: 0.1 10*3/uL (ref 0.0–0.1)
EOS PCT: 6 % — AB (ref 0–5)
Eosinophils Absolute: 0.5 10*3/uL (ref 0.0–0.7)
HEMATOCRIT: 32.8 % — AB (ref 39.0–52.0)
HEMOGLOBIN: 11.3 g/dL — AB (ref 13.0–17.0)
Lymphocytes Relative: 22 % (ref 12–46)
Lymphs Abs: 1.7 10*3/uL (ref 0.7–4.0)
MCH: 30.3 pg (ref 26.0–34.0)
MCHC: 34.5 g/dL (ref 30.0–36.0)
MCV: 87.9 fL (ref 78.0–100.0)
MONO ABS: 0.5 10*3/uL (ref 0.1–1.0)
MONOS PCT: 6 % (ref 3–12)
NEUTROS ABS: 5.1 10*3/uL (ref 1.7–7.7)
Neutrophils Relative %: 65 % (ref 43–77)
Platelets: 262 10*3/uL (ref 150–400)
RBC: 3.73 MIL/uL — ABNORMAL LOW (ref 4.22–5.81)
RDW: 15 % (ref 11.5–15.5)
WBC: 7.8 10*3/uL (ref 4.0–10.5)

## 2014-03-19 LAB — HEMOGLOBIN A1C
Hgb A1c MFr Bld: 8.7 % — ABNORMAL HIGH (ref <5.7)
Mean Plasma Glucose: 203 mg/dL — ABNORMAL HIGH (ref <117)

## 2014-03-19 NOTE — Patient Instructions (Signed)
  Stop Norvasc (Amlodipine)  Monitor daily BP's & call in 1 week  ++++++++++++++++++++++++++++++++++++++++++  Recommend the book "The END of DIETING" by Dr Baker Janus   and the book "The END of DIABETES " by Dr Excell Seltzer  At [email or web address] - get book & Audio CD's   +++++++++++++++++++++++++++++++++++++++++++++++++++++++++    Being diabetic has a  300% increased risk for heart attack, stroke, cancer, and alzheimer- type vascular dementia. It is very important that you work harder with diet by avoiding all foods that are white except chicken & fish. Avoid white rice (brown & wild rice is OK), white potatoes (sweetpotatoes in moderation is OK), White bread or wheat bread or anything made out of white flour like bagels, donuts, rolls, buns, biscuits, cakes, pastries, cookies, pizza crust, and pasta (made from white flour & egg whites) - vegetarian pasta or spinach or wheat pasta is OK. Multigrain breads like Arnold's or Pepperidge [employer], or multigrain sandwich thins or flatbreads.  Diet, exercise and weight loss can reverse and cure diabetes in the early stages.  Diet, exercise and weight loss is very important in the control and prevention of complications of diabetes which affects every system in your body, ie. Brain - dementia/stroke, eyes - glaucoma/blindness, heart - heart attack/heart failure, kidneys - dialysis, stomach - gastric paralysis, intestines - malabsorption, nerves - severe painful neuritis, circulation - gangrene & loss of a leg(s), and finally cancer and Alzheimers.    I recommend avoid fried & greasy foods,  sweets/candy, white rice (brown or wild rice or Quinoa is OK), white potatoes (sweet potatoes are OK) - anything made from white flour - bagels, doughnuts, rolls, buns, biscuits,white and wheat breads, pizza crust and traditional pasta made of white flour & egg white(vegetarian pasta or spinach or wheat pasta is OK).  Multi-grain bread is OK - like multi-grain flat bread or  sandwich thins. Avoid alcohol in excess. Exercise is also important.    Eat all the vegetables you want - avoid meat, especially red meat and dairy - especially cheese.  Cheese is the most concentrated form of trans-fats which is the worst thing to clog up our arteries. Veggie cheese is OK which can be found in the fresh produce section at [facility] or [employer] or [employer]

## 2014-03-19 NOTE — Progress Notes (Signed)
Patient ID: Ricky Morales, male   DOB: Aug 22, 1935, 79 y.o.   MRN: [id number]   This very nice 79 y.o.male presents for 3 month follow up with Hypertension, ASCAD s/p CABG, Hyperlipidemia, T2_NIDDM and Vitamin D Deficiency.    HTN predates since 1991 when he had his PTCA for ACS later followed by a CABG in 2003.  More recently he had Ht Cath to evaluate drop in EF and was found to have diffuse Dz with clotting of grafts. He had amlodipine for better control of accelerating BP and subsequently required addition of Lasix for worsening edema. He presents now with concern over daily BP's in the range 85-110 / 44-62. Also, he reports feeling very weak when his BP is less than 90 sys.Today's BP: 86/50 mmHg. Patient denies any cardiac type chest pain, palpitations, dyspnea/orthopnea/PND, dizziness or  Claudication Sx's.   Hyperlipidemia is controlled with diet & meds. Patient denies myalgias or other med SE's. Last Lipids were Lab Results  Component Value Date   CHOL 146 10/28/2013   HDL 38* 10/28/2013   LDLCALC 79 10/28/2013   TRIG 145 10/28/2013   CHOLHDL 3.8 10/28/2013    Also, the patient has history of T2_NIDDM w/ CKD since 2004 and last A1c was 9.5% in Mar 2015 reflecting his poor compliance. Patient denies any symptoms of reactive hypoglycemia, diabetic polys, paresthesias or visual blurring.   Further, Patient has history of Vitamin D Deficiency of 24 in 2008 and last vitamin D was 16 in Mar 2015. Patient supplements vitamin D without any suspected side-effects.   Medication List      ALEVE 220 MG tablet  Generic drug:  naproxen sodium  Take 400 mg by mouth daily as needed (pain).     amLODipine 5 MG tablet  Commonly known as:  NORVASC  Take 1 tablet (5 mg total) by mouth daily.     aspirin EC 81 MG tablet  Take 81 mg by mouth daily.     atorvastatin 80 MG tablet  Commonly known as:  LIPITOR  Take 80 mg by mouth daily.     carvedilol 25 MG tablet  Commonly known as:  COREG  Take 1 tablet (25  mg total) by mouth 2 (two) times daily with a meal.     clopidogrel 75 MG tablet  Commonly known as:  PLAVIX  Take 1 tablet (75 mg total) by mouth daily with breakfast.     enalapril 20 MG tablet  Commonly known as:  VASOTEC  Take 20 mg by mouth daily.     Fish Oil 1000 MG Caps  Take 1,000 mg by mouth daily.     furosemide 20 MG tablet  Commonly known as:  LASIX  Take 1 tablet (20 mg total) by mouth daily.     glipiZIDE 5 MG tablet  Commonly known as:  GLUCOTROL  Take 5 mg by mouth 2 (two) times daily before a meal.     LORazepam 2 MG tablet  Commonly known as:  ATIVAN  Take 2 mg by mouth at bedtime as needed (sleep).     Magnesium 250 MG Tabs  Take 250 mg by mouth 2 (two) times daily.     metFORMIN 500 MG tablet  Commonly known as:  GLUCOPHAGE  Take 1 tablet (500 mg total) by mouth 2 (two) times daily with a meal.     multivitamin with minerals Tabs tablet  Take 1 tablet by mouth daily.     nitroGLYCERIN 0.4 MG SL tablet  Commonly known as:  NITROSTAT  Place 1 tablet (0.4 mg total) under the tongue every 5 (five) minutes as needed for chest pain.     omeprazole 40 MG capsule  Commonly known as:  PRILOSEC  Take 40 mg by mouth daily as needed (acid reflux).     VITAMIN D PO  Take 1,500 Units by mouth daily.      Allergies  Allergen Reactions  . Actos [Pioglitazone] Other (See Comments)    Excessive weight gain  . Percocet [Oxycodone-Acetaminophen] Other (See Comments)    hallucinations   PMHx:   Past Medical History  Diagnosis Date  . Diabetes mellitus without complication   . Hyperlipidemia   . Hypertension   . Vitamin D deficiency   . GERD (gastroesophageal reflux disease)   . Type II or unspecified type diabetes mellitus with renal manifestations, not stated as uncontrolled     FHx:    Reviewed / unchanged SHx:    Reviewed / unchanged  Systems Review:  Constitutional: Denies fever, chills, wt changes, headaches, insomnia, fatigue, night sweats,  change in appetite. Eyes: Denies redness, blurred vision, diplopia, discharge, itchy, watery eyes.  ENT: Denies discharge, congestion, post nasal drip, epistaxis, sore throat, earache, hearing loss, dental pain, tinnitus, vertigo, sinus pain, snoring.  CV: Denies chest pain, palpitations, irregular heartbeat, syncope, dyspnea, diaphoresis, orthopnea, PND, claudication or edema. Respiratory: denies cough, dyspnea, DOE, pleurisy, hoarseness, laryngitis, wheezing.  Gastrointestinal: Denies dysphagia, odynophagia, heartburn, reflux, water brash, abdominal pain or cramps, nausea, vomiting, bloating, diarrhea, constipation, hematemesis, melena, hematochezia  or hemorrhoids. Genitourinary: Denies dysuria, frequency, urgency, nocturia, hesitancy, discharge, hematuria or flank pain. Musculoskeletal: Denies arthralgias, myalgias, stiffness, jt. swelling, pain, limping or strain/sprain.  Skin: Denies pruritus, rash, hives, warts, acne, eczema or change in skin lesion(s). Neuro: No weakness, tremor, incoordination, spasms, paresthesia or pain. Psychiatric: Denies confusion, memory loss or sensory loss. Endo: Denies change in weight, skin or hair change.  Heme/Lymph: No excessive bleeding, bruising or enlarged lymph nodes.  Exam:  BP 86/50  Pulse 62  Temp 98 F  Resp 18  Ht 5\' 10"    Wt 200 lb   BMI 28.70 kg/m2  Appears over nourished and in no distress. Eyes: PERRLA, EOMs, conjunctiva no swelling or erythema. Sinuses: No frontal/maxillary tenderness ENT/Mouth: EAC's clear, TM's nl w/o erythema, bulging. Nares clear w/o erythema, swelling, exudates. Oropharynx clear without erythema or exudates. Oral hygiene is good. Tongue normal, non obstructing. Hearing intact.  Neck: Supple. Thyroid nl. Car 2+/2+ without bruits, nodes or JVD. Chest: Respirations nl with BS clear & equal w/o rales, rhonchi, wheezing or stridor.  Cor: Heart sounds normal w/ regular rate and rhythm  With a Gr 2/4 Sys M in the Ao  area without gallops, clicks, or rubs. Peripheral pulses normal and equal  without edema.  Abdomen: Soft & bowel sounds normal. Non-tender w/o guarding, rebound, hernias, masses, or organomegaly.  Lymphatics: Unremarkable.  Musculoskeletal: Full ROM all peripheral extremities, joint stability, 5/5 strength, and normal gait.  Skin: Warm, dry without exposed rashes, lesions or ecchymosis apparent.  Neuro: Cranial nerves intact, reflexes equal bilaterally. Sensory-motor testing grossly intact. Tendon reflexes grossly intact.  Pysch: Alert & oriented x 3. Insight and judgement nl & appropriate. No ideations.  Assessment and Plan:  1. Hypertension/ Hypotension -  Recc d/c amlodipine & continue monitor blood pressure at home and call report in 1 week. Continue diet/meds same.  2. ASCAD s/p CABG and Ch CHF  3. Hyperlipidemia - Continue diet/meds, exercise,& lifestyle  modifications. Continue monitor periodic cholesterol/liver & renal functions   4. T2_NIDDM - encouraged better dietary compliance - encouraged patient to read "the End of Diabetes" by Dr Fredirick Lathe.   5. Vitamin D Deficiency - Continue supplementation.  Recommended regular exercise, BP monitoring, weight control, and discussed med and SE's. Recommended labs to assess and monitor clinical status & r/o azotemia, etc.. Further disposition pending results of labs.

## 2014-03-20 ENCOUNTER — Other Ambulatory Visit: Payer: Self-pay | Admitting: *Deleted

## 2014-03-20 DIAGNOSIS — N184 Chronic kidney disease, stage 4 (severe): Secondary | ICD-10-CM

## 2014-03-20 LAB — INSULIN, FASTING: Insulin fasting, serum: 21 u[IU]/mL (ref 3–28)

## 2014-03-20 LAB — BASIC METABOLIC PANEL WITH GFR
BUN: 64 mg/dL — AB (ref 6–23)
CO2: 25 mEq/L (ref 19–32)
CREATININE: 2.06 mg/dL — AB (ref 0.50–1.35)
Calcium: 9.8 mg/dL (ref 8.4–10.5)
Chloride: 98 mEq/L (ref 96–112)
GFR, EST AFRICAN AMERICAN: 35 mL/min — AB
GFR, Est Non African American: 30 mL/min — ABNORMAL LOW
GLUCOSE: 134 mg/dL — AB (ref 70–99)
POTASSIUM: 5.7 meq/L — AB (ref 3.5–5.3)
Sodium: 133 mEq/L — ABNORMAL LOW (ref 135–145)

## 2014-03-20 LAB — LIPID PANEL
CHOLESTEROL: 122 mg/dL (ref 0–200)
HDL: 31 mg/dL — ABNORMAL LOW (ref >39)
LDL Cholesterol: 52 mg/dL (ref 0–99)
Total CHOL/HDL Ratio: 3.9 Ratio
Triglycerides: 195 mg/dL — ABNORMAL HIGH (ref <150)
VLDL: 39 mg/dL (ref 0–40)

## 2014-03-20 LAB — HEPATIC FUNCTION PANEL
ALBUMIN: 4.2 g/dL (ref 3.5–5.2)
ALT: 24 U/L (ref 0–53)
AST: 20 U/L (ref 0–37)
Alkaline Phosphatase: 39 U/L (ref 39–117)
BILIRUBIN INDIRECT: 0.7 mg/dL (ref 0.2–1.2)
Bilirubin, Direct: 0.2 mg/dL (ref 0.0–0.3)
Total Bilirubin: 0.9 mg/dL (ref 0.2–1.2)
Total Protein: 7.3 g/dL (ref 6.0–8.3)

## 2014-03-20 LAB — MAGNESIUM: Magnesium: 2.3 mg/dL (ref 1.5–2.5)

## 2014-03-20 LAB — TSH: TSH: 1.417 u[IU]/mL (ref 0.350–4.500)

## 2014-03-20 LAB — VITAMIN D 25 HYDROXY (VIT D DEFICIENCY, FRACTURES): Vit D, 25-Hydroxy: 77 ng/mL (ref 30–89)

## 2014-03-26 ENCOUNTER — Encounter: Payer: Self-pay | Admitting: Internal Medicine

## 2014-03-26 ENCOUNTER — Ambulatory Visit (INDEPENDENT_AMBULATORY_CARE_PROVIDER_SITE_OTHER): Payer: Medicare Other | Admitting: Internal Medicine

## 2014-03-26 ENCOUNTER — Telehealth: Payer: Self-pay | Admitting: Internal Medicine

## 2014-03-26 VITALS — BP 92/48 | HR 60 | Temp 97.7°F | Resp 16 | Ht 70.0 in | Wt 203.4 lb

## 2014-03-26 DIAGNOSIS — E1129 Type 2 diabetes mellitus with other diabetic kidney complication: Secondary | ICD-10-CM

## 2014-03-26 DIAGNOSIS — Z79899 Other long term (current) drug therapy: Secondary | ICD-10-CM

## 2014-03-26 DIAGNOSIS — I959 Hypotension, unspecified: Secondary | ICD-10-CM

## 2014-03-26 LAB — BASIC METABOLIC PANEL
BUN: 53 mg/dL — AB (ref 6–23)
CO2: 21 mEq/L (ref 19–32)
Calcium: 9.2 mg/dL (ref 8.4–10.5)
Chloride: 104 mEq/L (ref 96–112)
Creat: 1.81 mg/dL — ABNORMAL HIGH (ref 0.50–1.35)
GLUCOSE: 123 mg/dL — AB (ref 70–99)
Potassium: 5.4 mEq/L — ABNORMAL HIGH (ref 3.5–5.3)
Sodium: 136 mEq/L (ref 135–145)

## 2014-03-26 NOTE — Progress Notes (Signed)
Subjective:    Patient ID: Ricky Morales, male    DOB: Dec 30, 1934, 79 y.o.   MRN: [id number]  HPI  Patient returns relating feeling immediately better after stopping Amlodipine 1 week ago. He still has episodes of weakness and  hypotension with BP 92/48 today. His last BUN/Creat was up to 64 / 2.06 . He also still has episodes of hypoglycemia. Medication Sig  . ACCU-CHEK SMARTVIEW test strip Use as directed  . aspirin EC 81 MG tablet Take 81 mg by mouth daily.  Marland Kitchen atorvastatin (LIPITOR) 80 MG tablet Take 80 mg by mouth daily.   . carvedilol (COREG) 25 MG tablet Take 1 tablet (25 mg total) by mouth 2 (two) times daily with a meal.  . Cholecalciferol (VITAMIN D PO) Take 1,500 Units by mouth daily.   . clopidogrel (PLAVIX) 75 MG tablet Take 1 tablet (75 mg total) by mouth daily with breakfast.  . enalapril (VASOTEC) 20 MG tablet Take 20 mg by mouth daily.  . furosemide (LASIX) 20 MG tablet Take 1 tablet (20 mg total) by mouth daily.  Marland Kitchen glipiZIDE (GLUCOTROL) 5 MG tablet Take 5 mg by mouth 2 (two) times daily before a meal.  . LORazepam (ATIVAN) 2 MG tablet Take 2 mg by mouth at bedtime as needed (sleep).  . Magnesium 250 MG TABS Take 250 mg by mouth 2 (two) times daily.  . metFORMIN (GLUCOPHAGE) 500 MG tablet Take 1 tablet (500 mg total) by mouth 2 (two) times daily with a meal.  . Multiple Vitamin (MULTIVITAMIN WITH MINERALS) TABS tablet Take 1 tablet by mouth daily.  . naproxen sodium (ALEVE) 220 MG tablet Take 400 mg by mouth daily as needed (pain).  . nitroGLYCERIN (NITROSTAT) 0.4 MG SL tablet Place 1 tablet (0.4 mg total) under the tongue every 5 (five) minutes as needed for chest pain.  . Omega-3 Fatty Acids (FISH OIL) 1000 MG CAPS Take 1,000 mg by mouth daily.   Marland Kitchen omeprazole (PRILOSEC) 40 MG capsule Take 40 mg by mouth daily as needed (acid reflux).   Allergies  Allergen Reactions  . Actos [Pioglitazone] Other (See Comments)    Excessive weight gain  . Percocet  [Oxycodone-Acetaminophen] Other (See Comments)    hallucinations   Past Medical History  Diagnosis Date  . Diabetes mellitus without complication   . Hyperlipidemia   . Hypertension   . Vitamin D deficiency   . GERD (gastroesophageal reflux disease)   . Type II or unspecified type diabetes mellitus with renal manifestations, not stated as uncontrolled    Review of Systems  10 point review Neg except as above.  Objective:   Physical Exam  BP 92/48  P 60  T 97.7 F  R 16  Ht 5\' 10"    Wt 203 lb 6.4 oz   BMI 29.18 kg/m2  HEENT - Eac's patent. TM's Nl. EOM's full. PERRLA. NasoOroPharynx clear. Neck - supple. Nl Thyroid. Carotids 2+ & No bruits, nodes, JVD Chest - Clear equal BS w/o Rales, rhonchi, wheezes. Cor - Nl HS. RRR w/o sig MGR. PP 1(+). 1 (+)  edema. Abd - No palpable organomegaly, masses or tenderness. BS nl. MS- FROM w/o deformities. Muscle power, tone and bulk Nl. Gait Nl. Neuro - No obvious Cr N abnormalities. Sensory, motor and Cerebellar functions appear Nl w/o focal abnormalities. Psyche - Mental status normal & appropriate.  No delusions, ideations or obvious mood abnormalities.  Assessment & Plan:   1. Hypotension, unspecified - advised taper Coreg 25 to  1/2 tab = 12.5 mg bid/q12h  2. Type II diabetes mellitus with renal manifestations - advised stop Glipizide and stricter diet.  3. Encounter for long-term (current) use of other medications - Basic metabolic panel- ROV 3 weeks

## 2014-03-26 NOTE — Telephone Encounter (Signed)
Line busy. Did not reach patient.

## 2014-03-26 NOTE — Patient Instructions (Signed)
  Stay off Amlodipine  +++++++++++++++++++++++++++++++++++  Decrease Carvedilol (Coreg) 25 mg to 1/2 tablet twice/daily  +++++++++++++++++++++++++++++++++++++++++++++++++++++++++++++++++   Decrease Glipizide 5 mg to 1/2 tablet twice/daily

## 2014-03-26 NOTE — Telephone Encounter (Signed)
New message          Pt would like to inform you about a blood test he took

## 2014-03-27 ENCOUNTER — Ambulatory Visit: Payer: Self-pay | Admitting: Internal Medicine

## 2014-03-30 ENCOUNTER — Telehealth: Payer: Self-pay | Admitting: Internal Medicine

## 2014-03-30 NOTE — Telephone Encounter (Signed)
Follow up  Pt called request a call back to discuss a blood test that was taken..pt also states that medications were changed because his blood pressure was low requests a call back to discuss.

## 2014-04-01 NOTE — Telephone Encounter (Signed)
Left pt a message to call back. 

## 2014-04-01 NOTE — Telephone Encounter (Signed)
Follow up    Pt called request a call back to discuss a blood test that was taken..pt also states that medications were changed because his blood pressure was low requests a call back to discuss

## 2014-04-07 NOTE — Telephone Encounter (Signed)
lmtcb

## 2014-04-13 ENCOUNTER — Encounter: Payer: Self-pay | Admitting: Internal Medicine

## 2014-04-13 ENCOUNTER — Ambulatory Visit (INDEPENDENT_AMBULATORY_CARE_PROVIDER_SITE_OTHER): Payer: Medicare Other | Admitting: Internal Medicine

## 2014-04-13 VITALS — BP 116/68 | HR 68 | Temp 98.2°F | Resp 16 | Ht 70.0 in | Wt 207.6 lb

## 2014-04-13 DIAGNOSIS — E559 Vitamin D deficiency, unspecified: Secondary | ICD-10-CM

## 2014-04-13 DIAGNOSIS — Z79899 Other long term (current) drug therapy: Secondary | ICD-10-CM

## 2014-04-13 DIAGNOSIS — I150 Renovascular hypertension: Secondary | ICD-10-CM

## 2014-04-13 DIAGNOSIS — E1129 Type 2 diabetes mellitus with other diabetic kidney complication: Secondary | ICD-10-CM

## 2014-04-13 LAB — CBC WITH DIFFERENTIAL/PLATELET
Basophils Absolute: 0.1 10*3/uL (ref 0.0–0.1)
Basophils Relative: 1 % (ref 0–1)
Eosinophils Absolute: 0.4 10*3/uL (ref 0.0–0.7)
Eosinophils Relative: 6 % — ABNORMAL HIGH (ref 0–5)
HCT: 31.9 % — ABNORMAL LOW (ref 39.0–52.0)
Hemoglobin: 10.9 g/dL — ABNORMAL LOW (ref 13.0–17.0)
LYMPHS PCT: 26 % (ref 12–46)
Lymphs Abs: 1.8 10*3/uL (ref 0.7–4.0)
MCH: 30.9 pg (ref 26.0–34.0)
MCHC: 34.2 g/dL (ref 30.0–36.0)
MCV: 90.4 fL (ref 78.0–100.0)
Monocytes Absolute: 0.5 10*3/uL (ref 0.1–1.0)
Monocytes Relative: 7 % (ref 3–12)
NEUTROS ABS: 4.1 10*3/uL (ref 1.7–7.7)
NEUTROS PCT: 60 % (ref 43–77)
Platelets: 248 10*3/uL (ref 150–400)
RBC: 3.53 MIL/uL — ABNORMAL LOW (ref 4.22–5.81)
RDW: 16.1 % — ABNORMAL HIGH (ref 11.5–15.5)
WBC: 6.9 10*3/uL (ref 4.0–10.5)

## 2014-04-13 NOTE — Patient Instructions (Signed)
   Decrease Glipizide to Suppertime only  OK if Glucose occasionally spikes to 160-180 mg%

## 2014-04-13 NOTE — Progress Notes (Signed)
Patient ID: Ricky Morales, male   DOB: Feb 21, 1935, 79 y.o.   MRN: [id number]   This very nice 79 y.o.MWM presents for 10 day f/u  follow up  After recently d/c'ing Amlodipine and reducing Coreg for management of postural orthostasis. In the interim he was seen by Nephrology for consultation and his enalapril was also d/c'd. He reports BP's are now normal in the range 108-126/60's.Today's BP: 116/68 mmHg. Patient denies any cardiac type chest pain, palpitations, dyspnea/orthopnea/PND, dizziness, claudication, or dependent edema.   Last Lipids were 03/19/2014: Cholesterol, Total 122; HDL Cholesterol by NMR 31*; LDL (calc) 52; Triglycerides 195.  Also, the patient has history of T2_NIDDM with Stage 3 CKD and patient denies any symptoms of reactive hypoglycemia, diabetic polys, paresthesias or visual blurring.  Last A1c was  8.7% on 03/19/2014.   Medication List   ACCU-CHEK SMARTVIEW test strip  Generic drug:  glucose blood  Use as directed     ALEVE 220 MG tablet  Generic drug:  naproxen sodium  Take 400 mg by mouth daily as needed (pain).     amLODipine 5 MG tablet  Commonly known as:  NORVASC     aspirin EC 81 MG tablet  Take 81 mg by mouth daily.     atorvastatin 80 MG tablet  Commonly known as:  LIPITOR  Take 80 mg by mouth daily.     carvedilol 25 MG tablet  Commonly known as:  COREG  Take 1 tablet (25 mg total) by mouth 2 (two) times daily with a meal.     clopidogrel 75 MG tablet  Commonly known as:  PLAVIX  Take 1 tablet (75 mg total) by mouth daily with breakfast.     Fish Oil 1000 MG Caps  Take 1,000 mg by mouth daily.     furosemide 20 MG tablet  Commonly known as:  LASIX  Take 1 tablet (20 mg total) by mouth daily.     glipiZIDE 5 MG tablet  Commonly known as:  GLUCOTROL  Take 5 mg by mouth 2 (two) times daily before a meal.     LORazepam 2 MG tablet  Commonly known as:  ATIVAN  Take 2 mg by mouth at bedtime as needed (sleep).     Magnesium 250 MG Tabs  Take 250  mg by mouth 2 (two) times daily.     metFORMIN 500 MG tablet  Commonly known as:  GLUCOPHAGE  Take 1 tablet (500 mg total) by mouth 2 (two) times daily with a meal.     multivitamin with minerals Tabs tablet  Take 1 tablet by mouth daily.     nitroGLYCERIN 0.4 MG SL tablet  Commonly known as:  NITROSTAT  Place 1 tablet (0.4 mg total) under the tongue every 5 (five) minutes as needed for chest pain.     omeprazole 40 MG capsule  Commonly known as:  PRILOSEC  Take 40 mg by mouth daily as needed (acid reflux).     VITAMIN D PO  Take 1,500 Units by mouth daily.     Allergies  Allergen Reactions  . Actos [Pioglitazone] Other (See Comments)    Excessive weight gain  . Percocet [Oxycodone-Acetaminophen] Other (See Comments)    hallucinations   PMHx:   Past Medical History  Diagnosis Date  . Diabetes mellitus without complication   . Hyperlipidemia   . Hypertension   . Vitamin D deficiency   . GERD (gastroesophageal reflux disease)   . Type II or unspecified type diabetes  mellitus with renal manifestations, not stated as uncontrolled    FHx:    Reviewed / unchanged SHx:    Reviewed / unchanged  Systems Review:  In addition to the HPI above,  No Fever-chills,  No Headache, No changes with Vision or hearing,  No problems swallowing food or Liquids,  No Chest pain or productive Cough or Shortness of Breath,  No Abdominal pain, No Nausea or Vomitting, Bowel movements are regular,  No Blood in stool or Urine,  No dysuria,  No new skin rashes or bruises,  No new joints pains-aches,  No new weakness, tingling, numbness in any extremity,  No recent weight loss,  No polyuria, polydypsia or polyphagia,  No significant Mental Stressors.  A full 10 point Review of Systems was done, except as stated above, all other Review of Systems were negative   Exam:  BP 116/68  Pulse 68  Temp98.2 F  Resp 16  Ht 5\' 10"    Wt 207 lb 9.6 oz   BMI 29.79 kg/m2  Appears well nourished  and in no distress. Eyes: PERRLA, EOMs, conjunctiva no swelling or erythema. Sinuses: No frontal/maxillary tenderness ENT/Mouth: EAC's clear, TM's nl w/o erythema, bulging. Nares clear w/o erythema, swelling, exudates. Oropharynx clear without erythema or exudates. Oral hygiene is good. Tongue normal, non obstructing. Hearing intact.  Neck: Supple. Thyroid nl. Car 2+/2+ without bruits, nodes or JVD. Chest: Respirations nl with BS clear & equal w/o rales, rhonchi, wheezing or stridor.  Cor: Heart sounds normal w/ regular rate and rhythm without sig. murmurs, gallops, clicks, or rubs. Peripheral pulses normal and equal  without edema.  Abdomen: Soft & bowel sounds normal. Non-tender w/o guarding, rebound, hernias, masses, or organomegaly.  Lymphatics: Unremarkable.  Musculoskeletal: Full ROM all peripheral extremities, joint stability, 5/5 strength, and normal gait.  Skin: Warm, dry without exposed rashes, lesions or ecchymosis apparent.  Neuro: Cranial nerves intact, reflexes equal bilaterally. Sensory-motor testing grossly intact. Tendon reflexes grossly intact.  Pysch: Alert & oriented x 3. Insight and judgement nl & appropriate. No ideations.  Assessment and Plan:  1. Hypertension - Continue monitor blood pressure at home. Continue diet/meds same.  2. Hyperlipidemia - Continue diet/meds, exercise,& lifestyle modifications. Continue monitor periodic cholesterol/liver & renal functions   3. T2_NIDDM /CKD3- Pt advised to taper his Glipizide 5 mg to 1/2 tab at supper only.  4. Vitamin D Deficiency - Continue supplementation.  Recommended regular exercise, BP monitoring, weight control, and discussed med and SE's. Recommended labs to assess and monitor clinical status. Further disposition pending results of labs.

## 2014-04-14 LAB — MAGNESIUM: MAGNESIUM: 1.8 mg/dL (ref 1.5–2.5)

## 2014-04-14 LAB — BASIC METABOLIC PANEL WITH GFR
BUN: 36 mg/dL — ABNORMAL HIGH (ref 6–23)
CHLORIDE: 99 meq/L (ref 96–112)
CO2: 27 mEq/L (ref 19–32)
Calcium: 10.7 mg/dL — ABNORMAL HIGH (ref 8.4–10.5)
Creat: 1.49 mg/dL — ABNORMAL HIGH (ref 0.50–1.35)
GFR, EST NON AFRICAN AMERICAN: 45 mL/min — AB
GFR, Est African American: 52 mL/min — ABNORMAL LOW
Glucose, Bld: 181 mg/dL — ABNORMAL HIGH (ref 70–99)
Potassium: 4.8 mEq/L (ref 3.5–5.3)
Sodium: 135 mEq/L (ref 135–145)

## 2014-04-19 NOTE — Progress Notes (Signed)
HPI Patinet is a 79 yo who has a history of CAD (s/p PTCA of RCA 1991; PTCA of LAD 1992)  He underwent stress test then cath cath in 2003 Showed:  LM 50%. LAD 70% mid; diffuse 50% mid; D1 70%; D2 50%; LCx 40%, OM2 90% RCA 40 to 505; LVEF 55%  He went on to have CABG (LIMA to D2, LAD; SVG to D1; SVG to OM2; SVG to PDA) I saw the patient in January  I recomm a stress test.  He did not have immediately  The patient was admitted for CP  . Troponin peaked at 0.66. BNP was elevated at 2972.0. He was started on ASA, Heparin gtt, BB, Statin, NTG prn.  Cath showed  occlusion of all SVGs. LIMA graft is patent. Native RCA and LCx occluded as well with faint collaterals. Medical therapy was maximized.  it appears that repeat revascularization would be limited by poor targets. ASA, Coreg 25 BID, statin. Plavix was also added. amloodipine was added for bette BP control and ACE-i was restarted post cath. He was given Lasix 40 mg PO and diuresed 1.4L. Lasix was decreased to 20 mg daily with an extra 20 daily as needed. Echo revealed an EF of 30-35%, grade two diastolic dysfunction, AVA 0.72cm^2 Since d/c he has felt pretty good  I saw him in June   He has been seen by Drs Melford Aase and [facility] who cut back on meds Doing usual activity ok  With increased exertion does get winded  No CP   No PND    Allergies  Allergen Reactions  . Actos [Pioglitazone] Other (See Comments)    Excessive weight gain  . Percocet [Oxycodone-Acetaminophen] Other (See Comments)    hallucinations    Current Outpatient Prescriptions  Medication Sig Dispense Refill  . ACCU-CHEK SMARTVIEW test strip Use as directed  300 each  2  . amLODipine (NORVASC) 5 MG tablet       . aspirin EC 81 MG tablet Take 81 mg by mouth daily.      Marland Kitchen atorvastatin (LIPITOR) 80 MG tablet Take 80 mg by mouth daily.       . Cholecalciferol (VITAMIN D PO) Take 1,500 Units by mouth daily.       . clopidogrel (PLAVIX) 75 MG tablet Take 1 tablet (75 mg total) by  mouth daily with breakfast.  30 tablet  5  . enalapril (VASOTEC) 20 MG tablet       . furosemide (LASIX) 20 MG tablet Take 1 tablet (20 mg total) by mouth daily.  60 tablet  5  . glipiZIDE (GLUCOTROL) 5 MG tablet Take 5 mg by mouth 2 (two) times daily before a meal.      . LORazepam (ATIVAN) 2 MG tablet Take 2 mg by mouth at bedtime as needed (sleep).      . Magnesium 250 MG TABS Take 250 mg by mouth 2 (two) times daily.      . metFORMIN (GLUCOPHAGE) 500 MG tablet Take 1 tablet (500 mg total) by mouth 2 (two) times daily with a meal.      . Multiple Vitamin (MULTIVITAMIN WITH MINERALS) TABS tablet Take 1 tablet by mouth daily.      . Omega-3 Fatty Acids (FISH OIL) 1000 MG CAPS Take 1,000 mg by mouth daily.       Marland Kitchen omeprazole (PRILOSEC) 40 MG capsule Take 40 mg by mouth daily as needed (acid reflux).       No current facility-administered medications for this  visit.    Past Medical History  Diagnosis Date  . Diabetes mellitus without complication   . Hyperlipidemia   . Hypertension   . Vitamin D deficiency   . GERD (gastroesophageal reflux disease)   . Type II or unspecified type diabetes mellitus with renal manifestations, not stated as uncontrolled     Past Surgical History  Procedure Laterality Date  . Appendectomy  1955  . Coronary artery bypass graft  2003  . Cardiac catheterization  1991, 1992, 2003  . Eye surgery  12/2006    Right CE/IOL    Family History  Problem Relation Age of Onset  . Heart disease Mother   . Diabetes Mother   . Cancer Father     History   Social History  . Marital Status: Married    Spouse Name: N/A    Number of Children: N/A  . Years of Education: N/A   Occupational History  . Not on file.   Social History Main Topics  . Smoking status: Former Smoker -- 4 years    Types: Cigarettes    Quit date: 08/28/1968  . Smokeless tobacco: Not on file  . Alcohol Use: Yes     Comment: occasionally  . Drug Use: Not on file  . Sexual Activity:  Not on file   Other Topics Concern  . Not on file   Social History Narrative  . No narrative on file    Review of Systems:  All systems reviewed.  They are negative to the above problem except as previously stated.  Vital Signs: BP 120/58  Pulse 70  Ht 5\' 10"  (1.778 m)  Wt 204 lb (92.534 kg)  BMI 29.27 kg/m2  Physical Exam Patient is in NAD HEENT:  Normocephalic, atraumatic. EOMI, PERRLA.  Neck: JVP is normal.  No bruits.  Lungs: clear to auscultation. No rales no wheezes.  Heart: Regular rate and rhythm. Normal S1, S2. No S3.   Gr II/VI systolic murmur LSB to base.  PMI not displaced.  Abdomen:  Supple, nontender. Normal bowel sounds. No masses. No hepatomegaly.  Extremities:   Good distal pulses throughout. No lower extremity edema.  Musculoskeletal :moving all extremities.  Neuro:   alert and oriented x3.  CN II-XII grossly intact  Assessment and Plan:  1.  CAD Patinet with severe diffuse disease.  He continues to feel good   VOlume status looks OK  I would keep on same regimen.  Watch salt  Would repeat echo to reassess LVEF    2.  ICM  As noted above  Echo  3.  HL  Keep on statin.    4.  Aortic stenosis.  Moderate.  Follow with echo   4.  DM  Continue meds.    6  CKD  Stage III  Follow in renal.

## 2014-04-20 ENCOUNTER — Ambulatory Visit (INDEPENDENT_AMBULATORY_CARE_PROVIDER_SITE_OTHER): Payer: Medicare Other | Admitting: Internal Medicine

## 2014-04-20 VITALS — BP 120/58 | HR 70 | Ht 70.0 in | Wt 204.0 lb

## 2014-04-20 DIAGNOSIS — I509 Heart failure, unspecified: Secondary | ICD-10-CM

## 2014-04-20 DIAGNOSIS — I5022 Chronic systolic (congestive) heart failure: Secondary | ICD-10-CM

## 2014-04-20 NOTE — Patient Instructions (Signed)
Your physician has requested that you have an echocardiogram. Echocardiography is a painless test that uses sound waves to create images of your heart. It provides your doctor with information about the size and shape of your heart and how well your heart's chambers and valves are working. This procedure takes approximately one hour. There are no restrictions for this procedure.  Your physician wants you to follow-up in: January 2016 with Dr Harrington Challenger.  You will receive a reminder letter in the mail two months in advance. If you don't receive a letter, please call our office to schedule the follow-up appointment.  Your physician recommends that you continue on your current medications as directed. Please refer to the Current Medication list given to you today.

## 2014-04-21 ENCOUNTER — Ambulatory Visit: Payer: Self-pay | Admitting: Internal Medicine

## 2014-04-23 ENCOUNTER — Ambulatory Visit: Payer: Self-pay | Admitting: Internal Medicine

## 2014-05-06 ENCOUNTER — Encounter: Payer: Self-pay | Admitting: Internal Medicine

## 2014-05-06 ENCOUNTER — Ambulatory Visit (INDEPENDENT_AMBULATORY_CARE_PROVIDER_SITE_OTHER): Payer: Medicare Other | Admitting: Internal Medicine

## 2014-05-06 VITALS — BP 102/64 | HR 68 | Temp 98.2°F | Resp 18 | Ht 70.0 in | Wt 209.0 lb

## 2014-05-06 DIAGNOSIS — E1129 Type 2 diabetes mellitus with other diabetic kidney complication: Secondary | ICD-10-CM

## 2014-05-06 DIAGNOSIS — E785 Hyperlipidemia, unspecified: Secondary | ICD-10-CM

## 2014-05-06 DIAGNOSIS — E559 Vitamin D deficiency, unspecified: Secondary | ICD-10-CM

## 2014-05-06 DIAGNOSIS — Z79899 Other long term (current) drug therapy: Secondary | ICD-10-CM

## 2014-05-06 DIAGNOSIS — I10 Essential (primary) hypertension: Secondary | ICD-10-CM

## 2014-05-06 LAB — CBC WITH DIFFERENTIAL/PLATELET
BASOS ABS: 0.1 10*3/uL (ref 0.0–0.1)
BASOS PCT: 1 % (ref 0–1)
Eosinophils Absolute: 0.5 10*3/uL (ref 0.0–0.7)
Eosinophils Relative: 7 % — ABNORMAL HIGH (ref 0–5)
HCT: 33.1 % — ABNORMAL LOW (ref 39.0–52.0)
HEMOGLOBIN: 11 g/dL — AB (ref 13.0–17.0)
Lymphocytes Relative: 27 % (ref 12–46)
Lymphs Abs: 1.8 10*3/uL (ref 0.7–4.0)
MCH: 30.8 pg (ref 26.0–34.0)
MCHC: 33.2 g/dL (ref 30.0–36.0)
MCV: 92.7 fL (ref 78.0–100.0)
Monocytes Absolute: 0.5 10*3/uL (ref 0.1–1.0)
Monocytes Relative: 7 % (ref 3–12)
NEUTROS ABS: 3.8 10*3/uL (ref 1.7–7.7)
NEUTROS PCT: 58 % (ref 43–77)
Platelets: 228 10*3/uL (ref 150–400)
RBC: 3.57 MIL/uL — ABNORMAL LOW (ref 4.22–5.81)
RDW: 16.7 % — ABNORMAL HIGH (ref 11.5–15.5)
WBC: 6.5 10*3/uL (ref 4.0–10.5)

## 2014-05-06 NOTE — Progress Notes (Signed)
Subjective:    Patient ID: Ricky Morales, male    DOB: [birth year], 79 y.o.   MRN: [id number]  HPI Patient returns for f/u of postural hypotension and med adjustment of d/c of amlodipine and enalapril and decreasing dose of coreg with resolution of postural Sx's and BP remaining Nl. Renal profile at last visit showed improvement of renal functions from stage 3 to stage 2 CKD which delighted the patient. Denies any current CV Sx's.    Medication List      aspirin EC 81 MG tablet  Take 81 mg by mouth daily.     atorvastatin 80 MG tablet  Commonly known as:  LIPITOR  Take 80 mg by mouth daily.     carvedilol 25 MG tablet - 1/2 tab bid  Commonly known as:  COREG     clopidogrel 75 MG tablet  Commonly known as:  PLAVIX  Take 1 tablet (75 mg total) by mouth daily with breakfast.     Fish Oil 1000 MG Caps  Take 1,000 mg by mouth daily.     furosemide 20 MG tablet  Commonly known as:  LASIX  Take 1 tablet (20 mg total) by mouth daily.     glipiZIDE 5 MG tablet  Commonly known as:  GLUCOTROL  Take 5 mg by mouth.     LORazepam 2 MG tablet  Commonly known as:  ATIVAN  Take 2 mg by mouth at bedtime as needed (sleep).     Magnesium 250 MG Tabs  Take 250 mg by mouth 2 (two) times daily.     metFORMIN 500 MG tablet  Commonly known as:  GLUCOPHAGE  Take 1 tablet (500 mg total) by mouth 2 (two) times daily with a meal.     multivitamin with minerals Tabs tablet  Take 1 tablet by mouth daily.     omeprazole 40 MG capsule  Commonly known as:  PRILOSEC  Take 40 mg by mouth daily as needed (acid reflux).     VITAMIN D PO  Take 1,500 Units by mouth daily.     Allergies  Allergen Reactions  . Actos [Pioglitazone] Other (See Comments)    Excessive weight gain  . Percocet [Oxycodone-Acetaminophen] Other (See Comments)    hallucinations   Past Medical History  Diagnosis Date  . Diabetes mellitus without complication   . Hyperlipidemia   . Hypertension   . Vitamin D deficiency    . GERD (gastroesophageal reflux disease)   . Type II or unspecified type diabetes mellitus with renal manifestations, not stated as uncontrolled    Review of Systems  In addition to the HPI above,  No Fever-chills,  No Headache, No changes with Vision or hearing,  No problems swallowing food or Liquids,  No Chest pain or productive Cough or Shortness of Breath,  No Abdominal pain, No Nausea or Vomitting, Bowel movements are regular,  No Blood in stool or Urine,  No dysuria,  No new skin rashes or bruises,  No new joints pains-aches,  No new weakness, tingling, numbness in any extremity,  No recent weight loss,  No polyuria, polydypsia or polyphagia,  No significant Mental Stressors.  A full 10 point Review of Systems was done, except as stated above, all other Review of Systems were negative  Objective:   Physical Exam BP 102/64  Pulse 68  Temp(Src) 98.2 F (36.8 C) (Temporal)  Resp 18  Ht 5\' 10"  (1.778 m)  Wt 209 lb (94.802 kg)  BMI 29.99 kg/m2  HEENT - Eac's patent. TM's Nl. EOM's full. PERRLA.  NasoOroPharynx clear. Neck - supple. Nl Thyroid. Carotids 2+ & No bruits, nodes, JVD Chest - Clear equal BS w/o Rales, rhonchi, wheezes. Cor - Nl HS. RRR w/o sig MGR. PP 1(+). No edema. Abd - No palpable organomegaly, masses or tenderness. BS nl. MS- FROM w/o deformities. Muscle power, tone and bulk Nl. Gait Nl. Neuro - No obvious Cr N abnormalities. Sensory, motor and Cerebellar functions appear Nl w/o focal abnormalities.  Assessment & Plan:   1. Essential hypertension - meds same as present and encouraged monitoring - TSH  2. Hyperlipidemia - encouraged prudent diet  3. T2 NIDDM w/ CKD- encouraged better diet - Insulin, fasting  4. Vitamin D deficiency- continue supplements - Vit D  25 hydroxy (rtn osteoporosis monitoring)  5. Encounter for long-term (current) use of other medications - CBC with Differential - BASIC METABOLIC PANEL WITH GFR - monitoring Kidney  functions - Hepatic function panel - Magnesium

## 2014-05-06 NOTE — Patient Instructions (Signed)

## 2014-05-06 NOTE — Progress Notes (Deleted)
lll

## 2014-05-07 LAB — HEPATIC FUNCTION PANEL
ALT: 22 U/L (ref 0–53)
AST: 19 U/L (ref 0–37)
Albumin: 4.2 g/dL (ref 3.5–5.2)
Alkaline Phosphatase: 36 U/L — ABNORMAL LOW (ref 39–117)
BILIRUBIN DIRECT: 0.2 mg/dL (ref 0.0–0.3)
BILIRUBIN INDIRECT: 0.6 mg/dL (ref 0.2–1.2)
BILIRUBIN TOTAL: 0.8 mg/dL (ref 0.2–1.2)
Total Protein: 7.1 g/dL (ref 6.0–8.3)

## 2014-05-07 LAB — BASIC METABOLIC PANEL WITH GFR
BUN: 39 mg/dL — ABNORMAL HIGH (ref 6–23)
CO2: 25 mEq/L (ref 19–32)
Calcium: 10.3 mg/dL (ref 8.4–10.5)
Chloride: 97 mEq/L (ref 96–112)
Creat: 1.56 mg/dL — ABNORMAL HIGH (ref 0.50–1.35)
GFR, Est African American: 49 mL/min — ABNORMAL LOW
GFR, Est Non African American: 42 mL/min — ABNORMAL LOW
GLUCOSE: 189 mg/dL — AB (ref 70–99)
POTASSIUM: 5.1 meq/L (ref 3.5–5.3)
SODIUM: 133 meq/L — AB (ref 135–145)

## 2014-05-07 LAB — INSULIN, FASTING: INSULIN FASTING, SERUM: 14.1 u[IU]/mL (ref 2.0–19.6)

## 2014-05-07 LAB — VITAMIN D 25 HYDROXY (VIT D DEFICIENCY, FRACTURES): VIT D 25 HYDROXY: 76 ng/mL (ref 30–89)

## 2014-05-07 LAB — MAGNESIUM: Magnesium: 1.9 mg/dL (ref 1.5–2.5)

## 2014-05-07 LAB — TSH: TSH: 1.347 u[IU]/mL (ref 0.350–4.500)

## 2014-06-01 ENCOUNTER — Ambulatory Visit (HOSPITAL_COMMUNITY): Payer: Medicare Other | Attending: Internal Medicine | Admitting: Radiology

## 2014-06-01 DIAGNOSIS — E119 Type 2 diabetes mellitus without complications: Secondary | ICD-10-CM | POA: Insufficient documentation

## 2014-06-01 DIAGNOSIS — I5022 Chronic systolic (congestive) heart failure: Secondary | ICD-10-CM | POA: Insufficient documentation

## 2014-06-01 DIAGNOSIS — E785 Hyperlipidemia, unspecified: Secondary | ICD-10-CM | POA: Insufficient documentation

## 2014-06-01 DIAGNOSIS — I10 Essential (primary) hypertension: Secondary | ICD-10-CM | POA: Insufficient documentation

## 2014-06-01 DIAGNOSIS — Z87891 Personal history of nicotine dependence: Secondary | ICD-10-CM | POA: Insufficient documentation

## 2014-06-01 NOTE — Progress Notes (Signed)
Echocardiogram performed.  

## 2014-06-03 ENCOUNTER — Other Ambulatory Visit: Payer: Self-pay | Admitting: Nephrology

## 2014-06-03 DIAGNOSIS — N179 Acute kidney failure, unspecified: Secondary | ICD-10-CM

## 2014-06-04 ENCOUNTER — Ambulatory Visit
Admission: RE | Admit: 2014-06-04 | Discharge: 2014-06-04 | Disposition: A | Discharge status: Home / Self Care | Payer: Medicare Other | Source: Ambulatory Visit | Attending: Nephrology | Admitting: Nephrology

## 2014-06-04 DIAGNOSIS — N179 Acute kidney failure, unspecified: Secondary | ICD-10-CM

## 2014-06-08 ENCOUNTER — Other Ambulatory Visit: Payer: Self-pay | Admitting: Internal Medicine

## 2014-06-30 ENCOUNTER — Other Ambulatory Visit: Payer: Self-pay | Admitting: Internal Medicine

## 2014-07-20 ENCOUNTER — Other Ambulatory Visit: Payer: Self-pay | Admitting: Physician Assistant

## 2014-07-20 ENCOUNTER — Other Ambulatory Visit: Payer: Self-pay | Admitting: *Deleted

## 2014-07-20 ENCOUNTER — Other Ambulatory Visit: Payer: Self-pay

## 2014-07-20 MED ORDER — GLIPIZIDE 5 MG PO TABS: 5.0000 mg | ORAL_TABLET | Freq: Three times a day (TID) | ORAL | Status: DC

## 2014-08-05 ENCOUNTER — Ambulatory Visit (INDEPENDENT_AMBULATORY_CARE_PROVIDER_SITE_OTHER): Payer: Medicare Other | Admitting: Physician Assistant

## 2014-08-05 ENCOUNTER — Encounter: Payer: Self-pay | Admitting: Physician Assistant

## 2014-08-05 VITALS — BP 128/68 | HR 64 | Temp 97.7°F | Resp 16 | Ht 70.0 in | Wt 210.0 lb

## 2014-08-05 DIAGNOSIS — N183 Chronic kidney disease, stage 3 (moderate): Secondary | ICD-10-CM

## 2014-08-05 DIAGNOSIS — Z0001 Encounter for general adult medical examination with abnormal findings: Secondary | ICD-10-CM

## 2014-08-05 DIAGNOSIS — Z1331 Encounter for screening for depression: Secondary | ICD-10-CM

## 2014-08-05 DIAGNOSIS — Z23 Encounter for immunization: Secondary | ICD-10-CM

## 2014-08-05 DIAGNOSIS — K2100 Gastro-esophageal reflux disease with esophagitis, without bleeding: Secondary | ICD-10-CM

## 2014-08-05 DIAGNOSIS — E559 Vitamin D deficiency, unspecified: Secondary | ICD-10-CM

## 2014-08-05 DIAGNOSIS — E785 Hyperlipidemia, unspecified: Secondary | ICD-10-CM

## 2014-08-05 DIAGNOSIS — Z789 Other specified health status: Secondary | ICD-10-CM

## 2014-08-05 DIAGNOSIS — E1122 Type 2 diabetes mellitus with diabetic chronic kidney disease: Secondary | ICD-10-CM

## 2014-08-05 DIAGNOSIS — R6889 Other general symptoms and signs: Secondary | ICD-10-CM

## 2014-08-05 DIAGNOSIS — I350 Nonrheumatic aortic (valve) stenosis: Secondary | ICD-10-CM

## 2014-08-05 DIAGNOSIS — I10 Essential (primary) hypertension: Secondary | ICD-10-CM

## 2014-08-05 DIAGNOSIS — I2510 Atherosclerotic heart disease of native coronary artery without angina pectoris: Secondary | ICD-10-CM

## 2014-08-05 DIAGNOSIS — Z79899 Other long term (current) drug therapy: Secondary | ICD-10-CM

## 2014-08-05 DIAGNOSIS — H3530 Unspecified macular degeneration: Secondary | ICD-10-CM

## 2014-08-05 LAB — LIPID PANEL
Cholesterol: 128 mg/dL (ref 0–200)
HDL: 33 mg/dL — AB (ref >39)
LDL Cholesterol: 61 mg/dL (ref 0–99)
TRIGLYCERIDES: 168 mg/dL — AB (ref <150)
Total CHOL/HDL Ratio: 3.9 Ratio
VLDL: 34 mg/dL (ref 0–40)

## 2014-08-05 LAB — CBC WITH DIFFERENTIAL/PLATELET
Basophils Absolute: 0.1 10*3/uL (ref 0.0–0.1)
Basophils Relative: 1 % (ref 0–1)
Eosinophils Absolute: 0.4 10*3/uL (ref 0.0–0.7)
Eosinophils Relative: 5 % (ref 0–5)
HCT: 32.4 % — ABNORMAL LOW (ref 39.0–52.0)
Hemoglobin: 11.2 g/dL — ABNORMAL LOW (ref 13.0–17.0)
LYMPHS PCT: 23 % (ref 12–46)
Lymphs Abs: 1.7 10*3/uL (ref 0.7–4.0)
MCH: 31.4 pg (ref 26.0–34.0)
MCHC: 34.6 g/dL (ref 30.0–36.0)
MCV: 90.8 fL (ref 78.0–100.0)
MPV: 9.1 fL — AB (ref 9.4–12.4)
Monocytes Absolute: 0.5 10*3/uL (ref 0.1–1.0)
Monocytes Relative: 6 % (ref 3–12)
NEUTROS ABS: 4.9 10*3/uL (ref 1.7–7.7)
Neutrophils Relative %: 65 % (ref 43–77)
Platelets: 239 10*3/uL (ref 150–400)
RBC: 3.57 MIL/uL — AB (ref 4.22–5.81)
RDW: 13.5 % (ref 11.5–15.5)
WBC: 7.5 10*3/uL (ref 4.0–10.5)

## 2014-08-05 LAB — HEPATIC FUNCTION PANEL
ALBUMIN: 4.2 g/dL (ref 3.5–5.2)
ALK PHOS: 43 U/L (ref 39–117)
ALT: 32 U/L (ref 0–53)
AST: 22 U/L (ref 0–37)
BILIRUBIN TOTAL: 1 mg/dL (ref 0.2–1.2)
Bilirubin, Direct: 0.2 mg/dL (ref 0.0–0.3)
Indirect Bilirubin: 0.8 mg/dL (ref 0.2–1.2)
TOTAL PROTEIN: 7.3 g/dL (ref 6.0–8.3)

## 2014-08-05 LAB — BASIC METABOLIC PANEL WITH GFR
BUN: 33 mg/dL — ABNORMAL HIGH (ref 6–23)
CHLORIDE: 100 meq/L (ref 96–112)
CO2: 27 mEq/L (ref 19–32)
CREATININE: 1.51 mg/dL — AB (ref 0.50–1.35)
Calcium: 9.9 mg/dL (ref 8.4–10.5)
GFR, EST NON AFRICAN AMERICAN: 44 mL/min — AB
GFR, Est African American: 51 mL/min — ABNORMAL LOW
Glucose, Bld: 236 mg/dL — ABNORMAL HIGH (ref 70–99)
POTASSIUM: 4.9 meq/L (ref 3.5–5.3)
Sodium: 138 mEq/L (ref 135–145)

## 2014-08-05 LAB — HEMOGLOBIN A1C
Hgb A1c MFr Bld: 9 % — ABNORMAL HIGH (ref <5.7)
Mean Plasma Glucose: 212 mg/dL — ABNORMAL HIGH (ref <117)

## 2014-08-05 LAB — MAGNESIUM: MAGNESIUM: 1.7 mg/dL (ref 1.5–2.5)

## 2014-08-05 LAB — TSH: TSH: 1.277 u[IU]/mL (ref 0.350–4.500)

## 2014-08-05 NOTE — Patient Instructions (Addendum)
We need to be very careful with the glipizide (Glucotrol). This medication forces your blood sugar down no matter what it is starting at. This can cause diabetics to have to eat more to keep their blood sugar elevated which goes against what our goals are for you. Only take 1/2 of the medication if your sugar is above 150 and you can take a whole pill if your sugar is above 180 morning or night.  If at any time you start to have low blood sugars in the morning or during the day please stop this medication. Please never take this medication if you are sick or can not eat. A low blood sugar is much more dangerous than a high blood sugar. Your brain needs two things, sugar and oxygen.      Bad carbs also include fruit juice, alcohol, and sweet tea. These are empty calories that do not signal to your brain that you are full.   Please remember the good carbs are still carbs which convert into sugar. So please measure them out no more than 1/2-1 cup of rice, oatmeal, pasta, and beans  Veggies are however free foods! Pile them on.   Not all fruit is created equal. Please see the list below, the fruit at the bottom is higher in sugars than the fruit at the top. Please avoid all dried fruits.

## 2014-08-05 NOTE — Progress Notes (Signed)
MEDICARE ANNUAL WELLNESS VISIT AND FOLLOW UP Assessment:   1. Essential hypertension - continue medications, DASH diet, exercise and monitor at home. Call if greater than 130/80.  - CBC with Differential - BASIC METABOLIC PANEL WITH GFR - Hepatic function panel - TSH  2. ASHD/CABG Control blood pressure, cholesterol, glucose, increase exercise.  Continue cardio follow up  3. Aortic stenosis Continue cardio follow up  4. Gastroesophageal reflux disease with esophagitis Continue PPI/H2 blocker, diet discussed  5. CKD stage 3 due to type 2 diabetes mellitus Discussed general issues about diabetes pathophysiology and management., Educational material distributed., Suggested low cholesterol diet., Encouraged aerobic exercise., Discussed foot care., Reminded to get yearly retinal exam. - increase glipizide to 1/2 in AM and 1/2 in PM, start to monitor BP - Hemoglobin A1c - HM DIABETES FOOT EXAM  6. Hyperlipidemia -continue medications, check lipids, decrease fatty foods, increase activity. - Lipid panel  7. Vitamin D deficiency - Vit D  25 hydroxy (rtn osteoporosis monitoring)  8. Medication management - Magnesium  9. Macular degeneration Continue to follow up with eye doctor  10. Need for prophylactic vaccination and inoculation against influenza - Flu vaccine HIGH DOSE PF  11. Need for prophylactic vaccination against Streptococcus pneumoniae (pneumococcus) prevnar   Plan:   During the course of the visit the patient was educated and counseled about appropriate screening and preventive services including:    Pneumococcal vaccine   Influenza vaccine  Td vaccine  Screening electrocardiogram  Colorectal cancer screening  Diabetes screening  Glaucoma screening  Nutrition counseling   Screening recommendations, referrals: Vaccinations: Please see documentation below and orders this visit.  Nutrition assessed and recommended  Colonoscopy  due  2018 Recommended yearly ophthalmology/optometry visit for glaucoma screening and checkup Recommended yearly dental visit for hygiene and checkup Advanced directives - requested  Conditions/risks identified: BMI: Discussed weight loss, diet, and increase physical activity.  Increase physical activity: AHA recommends 150 minutes of physical activity a week.  Medications reviewed Diabetes is not at goal, ACE/ARB therapy: Yes. Urinary Incontinence is not an issue: discussed non pharmacology and pharmacology options.  Fall risk: low- discussed PT, home fall assessment, medications.    Subjective:  Ricky Morales is a 79 y.o. male who presents for Medicare Annual Wellness Visit and 3 month follow up for HTN, hyperlipidemia, diabetes, and vitamin D Def.  Date of last medicare wellness visit was is unknown.  His blood pressure has been controlled at home, today their BP is BP: 128/68 mmHg He does not workout. He denies chest paiin, has not used NTG, has DOE but it has improved, no dizziness.  He has a history of CAD due to DM, had PTCA in 1991 and a CABG in 2003. He has IDCMP as well with EF 55% after medical optimization, follows with cardio, Dr. Harrington Challenger. Weight is stable. He was having postural hypotension, he is now off the amlodipine and his coreg is cut in half. His nephrologist stopped the enalapril.  He is on cholesterol medication, lipitor 80mg  and denies myalgias. His cholesterol is at goal. The cholesterol last visit was:   Lab Results  Component Value Date   CHOL 122 03/19/2014   HDL 31* 03/19/2014   LDLCALC 52 03/19/2014   TRIG 195* 03/19/2014   CHOLHDL 3.9 03/19/2014   He has been working on diet and exercise for Diabetes with diabetic chronic kidney disease and with other circulatory complications, he is on bASA, he is on ACE/ARB, checks his sugar every morning, he  take 1/2 glipizide at night, sugars are running 180's in the morning, and denies paresthesia of the feet, polydipsia and  polyuria. Last A1C was:   Lab Results  Component Value Date   HGBA1C 8.7* 03/19/2014   Patient is on Vitamin D supplement.   Lab Results  Component Value Date   VD25OH 76 05/06/2014     BMI is Body mass index is 30.13 kg/(m^2)., he is working on diet and exercise. Wt Readings from Last 3 Encounters:  08/05/14 210 lb (95.255 kg)  05/06/14 209 lb (94.802 kg)  04/20/14 204 lb (92.534 kg)    Names of Other Physician/Practitioners you currently use: 1. [facility] here for primary care 2. Dr. Renford Dills, eye doctor 3.Dr. Harrington Challenger, cardio 4. Nephrologist Patient Care Team: Unk Pinto, MD as PCP - General (Internal Medicine)  Medication Review: Current Outpatient Prescriptions on File Prior to Visit  Medication Sig Dispense Refill  . ACCU-CHEK SMARTVIEW test strip Use as directed 300 each 2  . aspirin EC 81 MG tablet Take 81 mg by mouth daily.    Marland Kitchen atorvastatin (LIPITOR) 80 MG tablet TAKE ONE TABLET DAILY FOR CHOLESTEROL 90 tablet 0  . carvedilol (COREG) 25 MG tablet     . Cholecalciferol (VITAMIN D PO) Take 1,500 Units by mouth daily.     . clopidogrel (PLAVIX) 75 MG tablet TAKE ONE TABLET DAILY WITH BREAKFAST 30 tablet 2  . furosemide (LASIX) 20 MG tablet Take 1 tablet (20 mg total) by mouth daily. 60 tablet 5  . glipiZIDE (GLUCOTROL) 5 MG tablet Take 1 tablet (5 mg total) by mouth 3 (three) times daily. 90 tablet 3  . LORazepam (ATIVAN) 2 MG tablet TAKE 1/2 TO 1 TABLET AT BEDTIME AS NEEDED FOR SLEEP 90 tablet 0  . Magnesium 250 MG TABS Take 250 mg by mouth 2 (two) times daily.    . metFORMIN (GLUCOPHAGE) 500 MG tablet Take 1 tablet (500 mg total) by mouth 2 (two) times daily with a meal.    . Multiple Vitamin (MULTIVITAMIN WITH MINERALS) TABS tablet Take 1 tablet by mouth daily.    . Omega-3 Fatty Acids (FISH OIL) 1000 MG CAPS Take 1,000 mg by mouth daily.     Marland Kitchen omeprazole (PRILOSEC) 40 MG capsule Take 40 mg by mouth daily as needed (acid  reflux).     No current facility-administered medications on file prior to visit.    Current Problems (verified) Patient Active Problem List   Diagnosis Date Noted  . NSTEMI (non-ST elevated myocardial infarction) 01/23/2014  . Aortic stenosis 01/21/2014  . ACS (acute coronary syndrome) 01/20/2014  . ASHD/CABG 10/28/2013  . Encounter for long-term (current) use of other medications 10/28/2013  . T2 NIDDM w/ CKD   . Hyperlipidemia   . Hypertension   . Vitamin D deficiency   . GERD (gastroesophageal reflux disease)     Screening Tests Health Maintenance  Topic Date Due  . FOOT EXAM  11/01/1944  . OPHTHALMOLOGY EXAM  11/01/1946  . TETANUS/TDAP  11/01/1955  . ZOSTAVAX  10/31/1996  . INFLUENZA VACCINE  03/28/2014  . HEMOGLOBIN A1C  09/19/2014  . URINE MICROALBUMIN  10/29/2014  . COLONOSCOPY  10/27/2015  . PNEUMOCOCCAL POLYSACCHARIDE VACCINE AGE 42 AND OVER  Completed    Immunization History  Administered Date(s) Administered  . DT 01/30/2005  . Influenza-Unspecified 05/10/2013  . PPD Test 09/13/2011  . Pneumococcal-Unspecified 09/08/2010    Preventative care: Last colonoscopy: 10/2005 due 2017 Stress test 12/2013 Echo  05/2014 55-60% Renal US 05/2014 CXR 08/2013  Prior vaccinations: TD or Tdap: 2006 Influenza: DUE Pneumococcal :2012 Prevnar13: DUE Shingles/Zostavax: declines    Medication List       This list is accurate as of: 08/05/14 11:25 AM.  Always use your most recent med list.               ACCU-CHEK SMARTVIEW test strip  Generic drug:  glucose blood  Use as directed     aspirin EC 81 MG tablet  Take 81 mg by mouth daily.     atorvastatin 80 MG tablet  Commonly known as:  LIPITOR  TAKE ONE TABLET DAILY FOR CHOLESTEROL     carvedilol 25 MG tablet  Commonly known as:  COREG     clopidogrel 75 MG tablet  Commonly known as:  PLAVIX  TAKE ONE TABLET DAILY WITH BREAKFAST     Fish Oil 1000 MG Caps  Take 1,000 mg by mouth daily.      furosemide 20 MG tablet  Commonly known as:  LASIX  Take 1 tablet (20 mg total) by mouth daily.     glipiZIDE 5 MG tablet  Commonly known as:  GLUCOTROL  Take 1 tablet (5 mg total) by mouth 3 (three) times daily.     LORazepam 2 MG tablet  Commonly known as:  ATIVAN  TAKE 1/2 TO 1 TABLET AT BEDTIME AS NEEDED FOR SLEEP     Magnesium 250 MG Tabs  Take 250 mg by mouth 2 (two) times daily.     metFORMIN 500 MG tablet  Commonly known as:  GLUCOPHAGE  Take 1 tablet (500 mg total) by mouth 2 (two) times daily with a meal.     multivitamin with minerals Tabs tablet  Take 1 tablet by mouth daily.     omeprazole 40 MG capsule  Commonly known as:  PRILOSEC  Take 40 mg by mouth daily as needed (acid reflux).     VITAMIN D PO  Take 1,500 Units by mouth daily.        Past Surgical History  Procedure Laterality Date  . Appendectomy  1955  . Coronary artery bypass graft  2003  . Cardiac catheterization  1991, 1992, 2003  . Eye surgery  12/2006    Right CE/IOL   Family History  Problem Relation Age of Onset  . Heart disease Mother   . Diabetes Mother   . Cancer Father     History reviewed: allergies, current medications, past family history, past medical history, past social history, past surgical history and problem list   Review of Systems:  Review of Systems  Constitutional: Positive for malaise/fatigue. Negative for fever, chills, weight loss and diaphoresis.  HENT: Negative.   Eyes: Negative.   Respiratory: Positive for shortness of breath. Negative for cough, hemoptysis, sputum production and wheezing.   Cardiovascular: Negative for chest pain, palpitations, orthopnea, claudication, leg swelling and PND.  Gastrointestinal: Negative.   Genitourinary: Negative.   Musculoskeletal: Positive for back pain and joint pain. Negative for myalgias, falls and neck pain.  Skin: Negative.   Neurological: Negative.  Negative for weakness.  Psychiatric/Behavioral: Negative.       Risk Factors: Tobacco History  Substance Use Topics  . Smoking status: Former Smoker -- 4 years    Types: Cigarettes    Quit date: 08/28/1968  . Smokeless tobacco: Not on file  . Alcohol Use: Yes     Comment: occasionally   He does not smoke.  Patient is  a former smoker. Are there smokers in your home (other than you)?  No  Alcohol Current alcohol use: social drinker  Caffeine Current caffeine use: coffee 1 /day  Exercise Current exercise: none  Nutrition/Diet Current diet: in general, a "healthy" diet    Cardiac risk factors: advanced age (older than 67 for men, 76 for women), diabetes mellitus, dyslipidemia, family history of premature cardiovascular disease, hypertension, male gender, obesity (BMI >= 30 kg/m2) and sedentary lifestyle.  Depression Screen (Note: if answer to either of the following is "Yes", a more complete depression screening is indicated)   Q1: Over the past two weeks, have you felt down, depressed or hopeless? No  Q2: Over the past two weeks, have you felt little interest or pleasure in doing things? No  Have you lost interest or pleasure in daily life? No  Do you often feel hopeless? No  Do you cry easily over simple problems? No  Activities of Daily Living In your present state of health, do you have any difficulty performing the following activities?:  Driving? No Managing money?  No Feeding yourself? No Getting from bed to chair? No Climbing a flight of stairs? No Preparing food and eating?: No Bathing or showering? No Getting dressed: No Getting to the toilet? No Using the toilet:No Moving around from place to place: No In the past year have you fallen or had a near fall?:No   Are you sexually active?  No  Do you have more than one partner?  No  Vision Difficulties: No  Hearing Difficulties: Yes Do you often ask people to speak up or repeat themselves? Yes Do you experience ringing or noises in your ears? No Do you have  difficulty understanding soft or whispered voices? Yes  Cognition  Do you feel that you have a problem with memory?No  Do you often misplace items? No  Do you feel safe at home?  Yes  Advanced directives Does patient have a Keyser? Yes Does patient have a Living Will? Yes   Objective:   Blood pressure 128/68, pulse 64, temperature 97.7 F (36.5 C), resp. rate 16, height 5\' 10"  (1.778 m), weight 210 lb (95.255 kg). Body mass index is 30.13 kg/(m^2).  General appearance: alert, no distress, WD/WN, male Cognitive Testing  Alert? Yes  Normal Appearance?Yes  Oriented to person? Yes  Place? Yes   Time? Yes  Recall of three objects?  Yes  Can perform simple calculations? Yes  Displays appropriate judgment?Yes  Can read the correct time from a watch face?Yes  HEENT: normocephalic, sclerae anicteric, bilateral cerumen impaction nares patent, no discharge or erythema, pharynx normal Oral cavity: MMM, no lesions Neck: supple, no lymphadenopathy, no thyromegaly, no masses Heart: RRR, normal S1, S2, 3/6 systolic murmur best at LSB Lungs: CTA bilaterally, no wheezes, rhonchi, or rales Abdomen: +bs, soft, non tender, non distended, no masses, no hepatomegaly, no splenomegaly Musculoskeletal: nontender, no swelling, no obvious deformity Extremities: no edema, no cyanosis, no clubbing Pulses: 2+ symmetric, upper and lower extremities, normal cap refill Neurological: alert, oriented x 3, CN2-12 intact, strength normal upper extremities and lower extremities, sensation normal throughout, DTRs 2+ throughout, no cerebellar signs, gait normal Psychiatric: normal affect, behavior normal, pleasant   Medicare Attestation I have personally reviewed: The patient's medical and social history Their use of alcohol, tobacco or illicit drugs Their current medications and supplements The patient's functional ability including ADLs,fall risks, home safety risks, cognitive, and  hearing and visual impairment Diet and  physical activities Evidence for depression or mood disorders  The patient's weight, height, BMI, and visual acuity have been recorded in the chart.  I have made referrals, counseling, and provided education to the patient based on review of the above and I have provided the patient with a written personalized care plan for preventive services.     Vicie Mutters, PA-C   08/05/2014

## 2014-08-06 ENCOUNTER — Encounter (HOSPITAL_COMMUNITY): Payer: Self-pay | Admitting: Interventional Cardiology

## 2014-08-06 LAB — VITAMIN D 25 HYDROXY (VIT D DEFICIENCY, FRACTURES): VIT D 25 HYDROXY: 54 ng/mL (ref 30–100)

## 2014-09-10 DIAGNOSIS — I10 Essential (primary) hypertension: Secondary | ICD-10-CM | POA: Diagnosis not present

## 2014-09-10 DIAGNOSIS — N183 Chronic kidney disease, stage 3 (moderate): Secondary | ICD-10-CM | POA: Diagnosis not present

## 2014-09-10 DIAGNOSIS — N179 Acute kidney failure, unspecified: Secondary | ICD-10-CM | POA: Diagnosis not present

## 2014-09-10 DIAGNOSIS — E1129 Type 2 diabetes mellitus with other diabetic kidney complication: Secondary | ICD-10-CM | POA: Diagnosis not present

## 2014-09-18 ENCOUNTER — Ambulatory Visit: Payer: Medicare Other | Admitting: Internal Medicine

## 2014-09-20 NOTE — Progress Notes (Signed)
This encounter was created in error - please disregard.

## 2014-09-21 ENCOUNTER — Encounter: Payer: Medicare Other | Admitting: Internal Medicine

## 2014-09-23 ENCOUNTER — Encounter: Payer: Self-pay | Admitting: Internal Medicine

## 2014-09-28 ENCOUNTER — Other Ambulatory Visit: Payer: Self-pay | Admitting: Physician Assistant

## 2014-10-11 ENCOUNTER — Other Ambulatory Visit: Payer: Self-pay | Admitting: Internal Medicine

## 2014-10-11 DIAGNOSIS — G4700 Insomnia, unspecified: Secondary | ICD-10-CM

## 2014-10-11 MED ORDER — LORAZEPAM 2 MG PO TABS: ORAL_TABLET | ORAL | Status: DC

## 2014-10-18 NOTE — Progress Notes (Signed)
Cardiology Office Note   Date:  10/19/2014   ID:  Ricky Morales, Ricky Morales 01/24/1935, MRN [id number]  PCP:  Alesia Richards, MD  Cardiologist:   Dorris Carnes, MD   No chief complaint on file.     History of Present Illness: Ricky Morales is a 80 y.o. male with a history of  Patinet is a 80 yo who has a history of CAD (s/p PTCA of RCA 1991; PTCA of LAD 1992) He underwent stress test then cath cath in 2003 Showed: LM 50%. LAD 70% mid; diffuse 50% mid; D1 70%; D2 50%; LCx 40%, OM2 90% RCA 40 to 505; LVEF 55% He went on to have CABG (LIMA to D2, LAD; SVG to D1; SVG to OM2; SVG to PDA) I saw the patient in January I recomm a stress test. He did not have immediately  The patient was admitted for CP . Troponin peaked at 0.66. BNP was elevated at 2972.0. He was started on ASA, Heparin gtt, BB, Statin, NTG prn. Cath showed occlusion of all SVGs. LIMA graft is patent. Native RCA and LCx occluded as well with faint collaterals. Medical therapy was maximized. it appears that repeat revascularization would be limited by poor targets. ASA, Coreg 25 BID, statin. Plavix was also added. amloodipine was added for bette BP control and ACE-i was restarted post cath. He was given Lasix 40 mg PO and diuresed 1.4L. Lasix was decreased to 20 mg daily with an extra 20 daily as needed. Echo revealed an EF of 30-35%, grade two diastolic dysfunction, AVA 0.72cm^2 Since d/c he has felt pretty good I saw him in Aug 2015 SInce seen he has done well from aa cardiac stanpoint.  Breathing is stabe  No CP NO dizziness       Current Outpatient Prescriptions  Medication Sig Dispense Refill  . ACCU-CHEK SMARTVIEW test strip Use as directed 300 each 2  . aspirin EC 81 MG tablet Take 81 mg by mouth daily.    Marland Kitchen atorvastatin (LIPITOR) 80 MG tablet TAKE ONE TABLET DAILY FOR CHOLESTEROL 90 tablet 1  . carvedilol (COREG) 25 MG tablet Take 12.5 mg by mouth 2 (two) times daily with a meal.    . Cholecalciferol  (VITAMIN D PO) Take 1,500 Units by mouth daily.     . clopidogrel (PLAVIX) 75 MG tablet TAKE ONE TABLET DAILY WITH BREAKFAST 30 tablet 2  . furosemide (LASIX) 20 MG tablet Take 1 tablet (20 mg total) by mouth daily. 60 tablet 5  . glipiZIDE (GLUCOTROL) 5 MG tablet Take 1 tablet (5 mg total) by mouth 3 (three) times daily. (Patient taking differently: Take 5 mg by mouth 2 (two) times daily before a meal. ) 90 tablet 3  . LORazepam (ATIVAN) 2 MG tablet Take 1/2 to 1 tablet if needed for sleep 90 tablet 1  . Magnesium 250 MG TABS Take 250 mg by mouth 2 (two) times daily.    . metFORMIN (GLUCOPHAGE) 500 MG tablet Take 1 tablet (500 mg total) by mouth 2 (two) times daily with a meal.    . Multiple Vitamin (MULTIVITAMIN WITH MINERALS) TABS tablet Take 1 tablet by mouth daily.    . Omega-3 Fatty Acids (FISH OIL) 1000 MG CAPS Take 1,000 mg by mouth daily.     Marland Kitchen omeprazole (PRILOSEC) 40 MG capsule Take 40 mg by mouth daily as needed (acid reflux).     No current facility-administered medications for this visit.    Allergies:   Actos and  Percocet   Past Medical History  Diagnosis Date  . Diabetes mellitus without complication   . Hyperlipidemia   . Hypertension   . Vitamin D deficiency   . GERD (gastroesophageal reflux disease)   . Type II or unspecified type diabetes mellitus with renal manifestations, not stated as uncontrolled     Past Surgical History  Procedure Laterality Date  . Appendectomy  1955  . Coronary artery bypass graft  2003  . Cardiac catheterization  1991, 1992, 2003  . Eye surgery  12/2006    Right CE/IOL  . Left and right heart catheterization with coronary/graft angiogram N/A 01/21/2014    Procedure: LEFT AND RIGHT HEART CATHETERIZATION WITH Beatrix Fetters;  Surgeon: Sinclair Grooms, MD;  Location: [facility] CATH LAB;  Service: Cardiovascular;  Laterality: N/A;     Social History:  The patient  reports that he quit smoking about 46 years ago. His smoking use  included Cigarettes. He quit after 4 years of use. He does not have any smokeless tobacco history on file. He reports that he drinks alcohol.   Family History:  The patient's family history includes Cancer in his father; Diabetes in his mother; Heart disease in his mother.    ROS:  Please see the history of present illness. All other systems are reviewed and  Negative to the above problem except as noted.    PHYSICAL EXAM: VS:  BP 132/70 mmHg  Pulse 72  Ht 6' (1.829 m)  Wt 201 lb 12.8 oz (91.536 kg)  BMI 27.36 kg/m2  GEN: Well nourished, well developed, in no acute distress HEENT: normal Neck: no JVD, carotid bruits, or masses Cardiac: RRR; Gr III/VI systolic murmur LSB  , rubs, or gallops,no edema  Respiratory:  clear to auscultation bilaterally, normal work of breathing GI: soft, nontender, nondistended, + BS  No hepatomegaly  MS: no deformity Moving all extremities   Skin: warm and dry, no rash Neuro:  Strength and sensation are intact Psych: euthymic mood, full affect   EKG:  EKG is not ordered today.   Lipid Panel    Component Value Date/Time   CHOL 128 08/05/2014 1135   TRIG 168* 08/05/2014 1135   HDL 33* 08/05/2014 1135   CHOLHDL 3.9 08/05/2014 1135   VLDL 34 08/05/2014 1135   LDLCALC 61 08/05/2014 1135      Wt Readings from Last 3 Encounters:  10/19/14 201 lb 12.8 oz (91.536 kg)  08/05/14 210 lb (95.255 kg)  05/06/14 209 lb (94.802 kg)      ASSESSMENT AND PLAN: 1.  CAD  No symptoms to suggest angina.  Keep on same regimen  2.  Aortic stenosis.  Moderate on last echo  Would set f/u echo for next fall     Current medicines are reviewed at length with the patient today.  The patient does not have concerns regarding medicines.  The following changes have been made: None  Labs/ tests ordered today include:  Echo in fall    Orders Placed This Encounter  Procedures  . 2D Echocardiogram without contrast     Disposition:   FU with  in    Signed, Dorris Carnes, MD  10/19/2014 2:17 PM    [facility] [place], [place], [place]  [address] Phone: [phone]; Fax: [phone]

## 2014-10-19 ENCOUNTER — Ambulatory Visit (INDEPENDENT_AMBULATORY_CARE_PROVIDER_SITE_OTHER): Payer: Medicare Other | Admitting: Internal Medicine

## 2014-10-19 ENCOUNTER — Encounter: Payer: Self-pay | Admitting: Internal Medicine

## 2014-10-19 ENCOUNTER — Other Ambulatory Visit: Payer: Self-pay | Admitting: Internal Medicine

## 2014-10-19 VITALS — BP 132/70 | HR 72 | Ht 72.0 in | Wt 201.8 lb

## 2014-10-19 DIAGNOSIS — I350 Nonrheumatic aortic (valve) stenosis: Secondary | ICD-10-CM

## 2014-10-19 NOTE — Patient Instructions (Signed)
Your physician recommends that you continue on your current medications as directed. Please refer to the Current Medication list given to you today.  Your physician wants you to follow-up in: November 2016 with Dr. Harrington Morales.  You will receive a reminder letter in the mail two months in advance. If you don't receive a letter, please call our office to schedule the follow-up appointment.  Your physician has requested that you have an echocardiogram. Echocardiography is a painless test that uses sound waves to create images of your heart. It provides your doctor with information about the size and shape of your heart and how well your heart's chambers and valves are working. This procedure takes approximately one hour. There are no restrictions for this procedure.  PLEASE SCHEDULE ECHO FOR DAY OF OR JUST PRIOR TO DR ROSS APPOINTMENT IN November.

## 2014-10-29 ENCOUNTER — Encounter: Payer: Self-pay | Admitting: Internal Medicine

## 2014-11-09 ENCOUNTER — Other Ambulatory Visit: Payer: Self-pay | Admitting: Physician Assistant

## 2014-11-09 NOTE — Telephone Encounter (Signed)
Rx has been sent to the pharmacy electronically. ° °

## 2014-11-11 ENCOUNTER — Ambulatory Visit (INDEPENDENT_AMBULATORY_CARE_PROVIDER_SITE_OTHER): Payer: Medicare Other | Admitting: Internal Medicine

## 2014-11-11 ENCOUNTER — Encounter: Payer: Self-pay | Admitting: Internal Medicine

## 2014-11-11 VITALS — BP 142/78 | HR 64 | Temp 97.5°F | Resp 16 | Ht 70.0 in | Wt 208.0 lb

## 2014-11-11 DIAGNOSIS — R6889 Other general symptoms and signs: Secondary | ICD-10-CM

## 2014-11-11 DIAGNOSIS — E1122 Type 2 diabetes mellitus with diabetic chronic kidney disease: Secondary | ICD-10-CM | POA: Diagnosis not present

## 2014-11-11 DIAGNOSIS — Z79899 Other long term (current) drug therapy: Secondary | ICD-10-CM | POA: Diagnosis not present

## 2014-11-11 DIAGNOSIS — I10 Essential (primary) hypertension: Secondary | ICD-10-CM | POA: Diagnosis not present

## 2014-11-11 DIAGNOSIS — Z125 Encounter for screening for malignant neoplasm of prostate: Secondary | ICD-10-CM

## 2014-11-11 DIAGNOSIS — E785 Hyperlipidemia, unspecified: Secondary | ICD-10-CM

## 2014-11-11 DIAGNOSIS — N183 Chronic kidney disease, stage 3 (moderate): Secondary | ICD-10-CM | POA: Diagnosis not present

## 2014-11-11 DIAGNOSIS — Z1212 Encounter for screening for malignant neoplasm of rectum: Secondary | ICD-10-CM

## 2014-11-11 DIAGNOSIS — E559 Vitamin D deficiency, unspecified: Secondary | ICD-10-CM

## 2014-11-11 DIAGNOSIS — Z0000 Encounter for general adult medical examination without abnormal findings: Secondary | ICD-10-CM

## 2014-11-11 DIAGNOSIS — Z23 Encounter for immunization: Secondary | ICD-10-CM | POA: Diagnosis not present

## 2014-11-11 DIAGNOSIS — Z9181 History of falling: Secondary | ICD-10-CM

## 2014-11-11 DIAGNOSIS — K2100 Gastro-esophageal reflux disease with esophagitis, without bleeding: Secondary | ICD-10-CM

## 2014-11-11 DIAGNOSIS — I2510 Atherosclerotic heart disease of native coronary artery without angina pectoris: Secondary | ICD-10-CM

## 2014-11-11 DIAGNOSIS — Z0001 Encounter for general adult medical examination with abnormal findings: Secondary | ICD-10-CM | POA: Diagnosis not present

## 2014-11-11 DIAGNOSIS — Z1331 Encounter for screening for depression: Secondary | ICD-10-CM

## 2014-11-11 NOTE — Progress Notes (Addendum)
Patient ID: Ricky Morales, male   DOB: [birth year], 80 y.o.   MRN: [id number]  [facility] VISIT AND CPE  Assessment:   1. Essential hypertension  - EKG 12-Lead - [place], RETROPERITNL ABD,  LTD - TSH  2. Hyperlipidemia  - Lipid panel  3. CKD stage 3 due to type 2 diabetes mellitus  - Microalbumin / creatinine urine ratio - HM DIABETES FOOT EXAM - LOW EXTREMITY NEUR EXAM DOCUM - Hemoglobin A1c - Insulin, random  4. Vitamin D deficiency  - Vit D  25 hydroxy (rtn osteoporosis monitoring)  5. ASHD/CABG   6. Gastroesophageal reflux disease with esophagitis   7. Depression screen   8. Screening for rectal cancer  - POC Hemoccult Bld/Stl (3-Cd Home Screen); Future  9. Prostate cancer screening   10. Need for prophylactic vaccination with tetanus-diphtheria (TD)  - DT Vaccine greater than 7yo IM  11. At low risk for fall   12. Medication management  - Urine Microscopic - CBC with Differential/Platelet - BASIC METABOLIC PANEL WITH GFR - Hepatic function panel - Magnesium  13. Routine general medical examination at a health care facility  Plan:   During the course of the visit the patient was educated and counseled about appropriate screening and preventive services including:    Pneumococcal vaccine   Influenza vaccine  Td vaccine  Screening electrocardiogram  Bone densitometry screening  Colorectal cancer screening  Diabetes screening  Glaucoma screening  Nutrition counseling   Advanced directives: requested  Screening recommendations, referrals: Vaccinations: Immunization History  Administered Date(s) Administered  . DT 01/30/2005, 11/11/2014  . Influenza, High Dose Seasonal PF 08/05/2014  . Influenza-Unspecified 05/10/2013  . PPD Test 09/13/2011  . Pneumococcal Conjugate-13 08/05/2014  . Pneumococcal-Unspecified 09/08/2010  Shingles vaccine declined Hep B vaccine not indicated  Nutrition assessed and recommended   Colonoscopy 10/2005 Recommended yearly ophthalmology/optometry visit for glaucoma screening and checkup Recommended yearly dental visit for hygiene and checkup Advanced directives - yes  Conditions/risks identified: BMI: Discussed weight loss, diet, and increase physical activity.  Increase physical activity: AHA recommends 150 minutes of physical activity a week.  Medications reviewed Diabetes is not at goal, ACE/ARB therapy: Yes. Urinary Incontinence is not an issue: discussed non pharmacology and pharmacology options.  Fall risk: low- discussed PT, home fall assessment, medications.   Subjective:    Ricky Morales is a 80 y.o. male who presents for Medicare Annual Wellness Visit and complete physical.  Date of last medicare wellness visit was 08/05/2014.  Patient has been followed for HTN, ASCAD, T2_NIDDM  , Hyperlipidemia, and Vitamin D Deficiency.   HTN predates since 1991 when he presented with ACS and had a PTCA. Later in 2003 he underwent CABG and is followed by Dr Dorris Carnes.. Patient's BP has been controlled at home.Today's BP was 142/78. Patient denies any recent cardiac symptoms as chest pain, palpitations, shortness of breath, dizziness or ankle swelling.   Patient's hyperlipidemia is controlled with diet and medications. Patient denies myalgias or other medication SE's. Last lipids were at goal - Total Cholesterol,  128; HDL 33; LDL 61; Trig 168 on 08/05/2014.   Patient has T2_NIDDM since 2004 with CKD3 (GFR 47 ml/min) and is followed by Nephrology. He reports FBG's range betw 115-155 and  denies reactive hypoglycemic symptoms, visual blurring, diabetic polys or paresthesias. Last A1c was not at goal due to admitted poor dietary compliance with A1c 9.0% on 08/05/2014.   Finally, patient has history of Vitamin D Deficiency of 24 in  2008 and last vitamin D was 54 on 08/05/2014.  Names of Other Physician/Practitioners you currently use: 1. [facility] here for primary care 2. Dr Renford Dills, eye doctor, last visit monthly 3. Dr ?, DDS, dentist, last visit 2015  Patient Care Team: Unk Pinto, MD as PCP - General (Internal Medicine)  Medication Review: Current Outpatient Prescriptions on File Prior to Visit  Medication Sig Dispense Refill  . ACCU-CHEK SMARTVIEW test strip Use as directed 300 each 2  . aspirin EC 81 MG tablet Take 81 mg by mouth daily.    Marland Kitchen atorvastatin (LIPITOR) 80 MG tablet TAKE ONE TABLET DAILY FOR CHOLESTEROL 90 tablet 1  . carvedilol (COREG) 25 MG tablet Take 12.5 mg by mouth 2 (two) times daily with a meal.    . carvedilol (COREG) 25 MG tablet TAKE ONE TABLET TWICE DAILY WITH A MEAL 60 tablet 0  . Cholecalciferol (VITAMIN D PO) Take 1,500 Units by mouth daily.     . clopidogrel (PLAVIX) 75 MG tablet TAKE ONE TABLET DAILY WITH BREAKFAST 30 tablet 6  . furosemide (LASIX) 20 MG tablet TAKE ONE TABLET EACH DAY AND AN EXTRA TABLET DAILY AS NEEDED IF WEIGHT INCREASES 2 LBS IN 24 HOURS OR 5 LBS IN A WEEK 60 tablet 0  . glipiZIDE (GLUCOTROL) 5 MG tablet Take 1 tablet (5 mg total) by mouth 3 (three) times daily. (Patient taking differently: Take 5 mg by mouth 2 (two) times daily before a meal. ) 90 tablet 3  . LORazepam (ATIVAN) 2 MG tablet Take 1/2 to 1 tablet if needed for sleep 90 tablet 1  . Magnesium 250 MG TABS Take 250 mg by mouth 2 (two) times daily.    . metFORMIN (GLUCOPHAGE) 500 MG tablet Take 1 tablet (500 mg total) by mouth 2 (two) times daily with a meal.    . Multiple Vitamin (MULTIVITAMIN WITH MINERALS) TABS tablet Take 1 tablet by mouth daily.    . Omega-3 Fatty Acids (FISH OIL) 1000 MG CAPS Take 1,000 mg by mouth daily.     Marland Kitchen omeprazole (PRILOSEC) 40 MG capsule Take 40 mg by mouth daily as needed (acid reflux).     No current facility-administered medications on file prior to visit.    Current Problems (verified) Patient Active Problem List   Diagnosis Date Noted  . Macular degeneration  08/05/2014  . NSTEMI (non-ST elevated myocardial infarction) 01/23/2014  . Aortic stenosis 01/21/2014  . ACS (acute coronary syndrome) 01/20/2014  . ASHD/CABG 10/28/2013  . Medication management 10/28/2013  . CKD stage 3 due to type 2 diabetes mellitus   . Hyperlipidemia   . Hypertension   . Vitamin D deficiency   . GERD (gastroesophageal reflux disease)     Screening Tests Health Maintenance  Topic Date Due  . OPHTHALMOLOGY EXAM  11/01/1946  . TETANUS/TDAP  11/01/1955  . ZOSTAVAX  10/31/1996  . URINE MICROALBUMIN  10/29/2014  . HEMOGLOBIN A1C  02/04/2015  . INFLUENZA VACCINE  03/29/2015  . FOOT EXAM  08/06/2015  . COLONOSCOPY  10/27/2015  . PNA vac Low Risk Adult  Completed    Immunization History  Administered Date(s) Administered  . DT 01/30/2005, 11/11/2014  . Influenza, High Dose Seasonal PF 08/05/2014  . Influenza-Unspecified 05/10/2013  . PPD Test 09/13/2011  . Pneumococcal Conjugate-13 08/05/2014  . Pneumococcal-Unspecified 09/08/2010    Preventative care: Last colonoscopy: 10/2005  History reviewed: allergies, current medications, past family history, past medical history, past social history, past surgical  history and problem list  Risk Factors: Tobacco History  Substance Use Topics  . Smoking status: Former Smoker -- 4 years    Types: Cigarettes    Quit date: 08/28/1968  . Smokeless tobacco: Not on file  . Alcohol Use: Yes     Comment: occasionally   He does not smoke.  Patient is a former smoker. Are there smokers in your home (other than you)?  No  Alcohol Current alcohol use: social drinker  Caffeine Current caffeine use: coffee 1-2 cups /day  Exercise Current exercise: coffee 1-2 cups /day  Nutrition/Diet Current diet: in general, an "unhealthy" diet  Cardiac risk factors: advanced age (older than 11 for men, 9 for women), diabetes mellitus, dyslipidemia, hypertension, male gender, microalbuminuria, obesity (BMI >= 30 kg/m2),  sedentary lifestyle and smoking/ tobacco exposure.  Depression Screen (Note: if answer to either of the following is "Yes", a more complete depression screening is indicated)   Q1: Over the past two weeks, have you felt down, depressed or hopeless? No  Q2: Over the past two weeks, have you felt little interest or pleasure in doing things? No  Have you lost interest or pleasure in daily life? No  Do you often feel hopeless? No  Do you cry easily over simple problems? No  Activities of Daily Living In your present state of health, do you have any difficulty performing the following activities?:  Driving? No Managing money?  No Feeding yourself? No Getting from bed to chair? No Climbing a flight of stairs? No Preparing food and eating?: No Bathing or showering? No Getting dressed: No Getting to the toilet? No Using the toilet:No Moving around from place to place: No In the past year have you fallen or had a near fall?:No   Are you sexually active?  Yes  Do you have more than one partner?  No  Vision Difficulties: No  Hearing Difficulties: No Do you often ask people to speak up or repeat themselves? No Do you experience ringing or noises in your ears? No Do you have difficulty understanding soft or whispered voices? No  Cognition  Do you feel that you have a problem with memory?No  Do you often misplace items? No  Do you feel safe at home?  Yes  Advanced directives Does patient have a [place]? Yes Does patient have a Living Will? Yes  Past Medical History  Diagnosis Date  . Diabetes mellitus without complication   . Hyperlipidemia   . Hypertension   . Vitamin D deficiency   . GERD (gastroesophageal reflux disease)   . Type II or unspecified type diabetes mellitus with renal manifestations, not stated as uncontrolled     Past Surgical History  Procedure Laterality Date  . Appendectomy  1955  . Coronary artery bypass graft  2003  . Cardiac  catheterization  1991, 1992, 2003  . Eye surgery  12/2006    Right CE/IOL  . Left and right heart catheterization with coronary/graft angiogram N/A 01/21/2014    Procedure: LEFT AND RIGHT HEART CATHETERIZATION WITH Beatrix Fetters;  Surgeon: Sinclair Grooms, MD;  Location: [facility] CATH LAB;  Service: Cardiovascular;  Laterality: N/A;    ROS: Constitutional: Denies fever, chills, weight loss/gain, headaches, insomnia, fatigue, night sweats or change in appetite. Eyes: Denies redness, blurred vision, diplopia, discharge, itchy or watery eyes.  ENT: Denies discharge, congestion, post nasal drip, epistaxis, sore throat, earache, hearing loss, dental pain, Tinnitus, Vertigo, Sinus pain or snoring.  Cardio:  Denies chest pain, palpitations, irregular heartbeat, syncope, dyspnea, diaphoresis, orthopnea, PND, claudication or edema Respiratory: denies cough, dyspnea, DOE, pleurisy, hoarseness, laryngitis or wheezing.  Gastrointestinal: Denies dysphagia, heartburn, reflux, water brash, pain, cramps, nausea, vomiting, bloating, diarrhea, constipation, hematemesis, melena, hematochezia, jaundice or hemorrhoids Genitourinary: Denies dysuria, frequency, urgency, nocturia, hesitancy, discharge, hematuria or flank pain Musculoskeletal: Denies arthralgia, myalgia, stiffness, Jt. Swelling, pain, limp or strain/sprain. Denies Falls. Skin: Denies puritis, rash, hives, warts, acne, eczema or change in skin lesion Neuro: No weakness, tremor, incoordination, spasms, paresthesia or pain Psychiatric: Denies confusion, memory loss or sensory loss. Denies Depression. Endocrine: Denies change in weight, skin, hair change, nocturia, and paresthesia, diabetic polys, visual blurring or hyper / hypo glycemic episodes.  Heme/Lymph: No excessive bleeding, bruising or enlarged lymph nodes.  Objective:     BP 142/78   Pulse 64  Temp 97.5 F   Resp 16  Ht 5\' 10"    Wt 208 lb     BMI 30  General Appearance:  Alert   WD/WN, male , in no apparent distress. Eyes: PERRLA, EOMs, conjunctiva no swelling or erythema, and fundi with scattered dot/blot exudates and retinal scars from prior lasering. Sinuses: No frontal/maxillary tenderness ENT/Mouth: EACs patent / TMs  nl. Nares clear without erythema, swelling, mucoid exudates. Oral hygiene is good. No erythema, swelling, or exudate. Tongue normal, non-obstructing. Tonsils not swollen or erythematous. Hearing normal.  Neck: Supple, thyroid normal. No bruits, nodes or JVD. Respiratory: Respiratory effort normal.  BS equal and clear bilateral without rales, rhonci, wheezing or stridor. Cardio: Heart sounds are normal with regular rate and rhythm and no murmurs, rubs or gallops. Peripheral pulses are normal and equal bilaterally without edema. No aortic or femoral bruits. Chest: symmetric with normal excursions and percussion.  Abdomen: Flat, soft, with nl bowel sounds. Nontender, no guarding, rebound, hernias, masses, or organomegaly.  Lymphatics: Non tender without lymphadenopathy.  Genitourinary: No hernias.Testes nl. DRE - prostate nl for age - smooth & firm w/o nodules. Musculoskeletal: Full ROM all peripheral extremities, joint stability, 5/5 strength, and normal gait. Skin: Warm and dry without rashes, lesions, cyanosis, clubbing or  ecchymosis.  Neuro: Cranial nerves intact, reflexes equal bilaterally. Normal muscle tone, no cerebellar symptoms. DTR's Nl/= in UE's and KJ's & AJ's absent bilaterally. Sensation intact by monofilament testing to the toes bilaterally.  Pysch: Awake and oriented X 3 with normal affect, insight and judgment appropriate.   Cognitive Testing  Alert? Yes  Normal Appearance? Yes  Oriented to person? Yes  Place? Yes   Time? Yes  Recall of three objects?  Yes  Can perform simple calculations? Yes  Displays appropriate judgment? Yes  Can read the correct time from a watch/clock? Yes  Medicare Attestation I have personally  reviewed: The patient's medical and social history Their use of alcohol, tobacco or illicit drugs Their current medications and supplements The patient's functional ability including ADLs,fall risks, home safety risks, cognitive, and hearing and visual impairment Diet and physical activities Evidence for depression or mood disorders  The patient's weight, height, BMI, and visual acuity have been recorded in the chart.  I have made referrals, counseling, and provided education to the patient based on review of the above and I have provided the patient with a written personalized care plan for preventive services.    Carman Essick DAVID, MD   11/11/2014

## 2014-11-11 NOTE — Patient Instructions (Signed)

## 2014-11-12 LAB — CBC WITH DIFFERENTIAL/PLATELET
Basophils Absolute: 0.1 10*3/uL (ref 0.0–0.1)
Basophils Relative: 1 % (ref 0–1)
Eosinophils Absolute: 0.5 10*3/uL (ref 0.0–0.7)
Eosinophils Relative: 8 % — ABNORMAL HIGH (ref 0–5)
HCT: 31.6 % — ABNORMAL LOW (ref 39.0–52.0)
HEMOGLOBIN: 10.4 g/dL — AB (ref 13.0–17.0)
LYMPHS ABS: 1.8 10*3/uL (ref 0.7–4.0)
Lymphocytes Relative: 28 % (ref 12–46)
MCH: 31 pg (ref 26.0–34.0)
MCHC: 32.9 g/dL (ref 30.0–36.0)
MCV: 94 fL (ref 78.0–100.0)
MONOS PCT: 8 % (ref 3–12)
MPV: 9.1 fL (ref 8.6–12.4)
Monocytes Absolute: 0.5 10*3/uL (ref 0.1–1.0)
NEUTROS ABS: 3.6 10*3/uL (ref 1.7–7.7)
NEUTROS PCT: 55 % (ref 43–77)
PLATELETS: 214 10*3/uL (ref 150–400)
RBC: 3.36 MIL/uL — AB (ref 4.22–5.81)
RDW: 15.8 % — ABNORMAL HIGH (ref 11.5–15.5)
WBC: 6.5 10*3/uL (ref 4.0–10.5)

## 2014-11-12 LAB — URINALYSIS, MICROSCOPIC ONLY
BACTERIA UA: NONE SEEN
CRYSTALS: NONE SEEN
Casts: NONE SEEN
SQUAMOUS EPITHELIAL / LPF: NONE SEEN

## 2014-11-12 LAB — BASIC METABOLIC PANEL WITH GFR
BUN: 33 mg/dL — ABNORMAL HIGH (ref 6–23)
CALCIUM: 9.2 mg/dL (ref 8.4–10.5)
CO2: 25 meq/L (ref 19–32)
CREATININE: 1.45 mg/dL — AB (ref 0.50–1.35)
Chloride: 101 mEq/L (ref 96–112)
GFR, EST AFRICAN AMERICAN: 53 mL/min — AB
GFR, EST NON AFRICAN AMERICAN: 46 mL/min — AB
Glucose, Bld: 183 mg/dL — ABNORMAL HIGH (ref 70–99)
POTASSIUM: 5.3 meq/L (ref 3.5–5.3)
Sodium: 137 mEq/L (ref 135–145)

## 2014-11-12 LAB — HEPATIC FUNCTION PANEL
ALT: 17 U/L (ref 0–53)
AST: 17 U/L (ref 0–37)
Albumin: 3.8 g/dL (ref 3.5–5.2)
Alkaline Phosphatase: 38 U/L — ABNORMAL LOW (ref 39–117)
BILIRUBIN INDIRECT: 0.5 mg/dL (ref 0.2–1.2)
BILIRUBIN TOTAL: 0.6 mg/dL (ref 0.2–1.2)
Bilirubin, Direct: 0.1 mg/dL (ref 0.0–0.3)
TOTAL PROTEIN: 6.5 g/dL (ref 6.0–8.3)

## 2014-11-12 LAB — LIPID PANEL
Cholesterol: 121 mg/dL (ref 0–200)
HDL: 34 mg/dL — AB (ref >=40)
LDL Cholesterol: 65 mg/dL (ref 0–99)
Total CHOL/HDL Ratio: 3.6 Ratio
Triglycerides: 112 mg/dL (ref <150)
VLDL: 22 mg/dL (ref 0–40)

## 2014-11-12 LAB — TSH: TSH: 1.403 u[IU]/mL (ref 0.350–4.500)

## 2014-11-12 LAB — VITAMIN D 25 HYDROXY (VIT D DEFICIENCY, FRACTURES): VIT D 25 HYDROXY: 66 ng/mL (ref 30–100)

## 2014-11-12 LAB — INSULIN, RANDOM: INSULIN: 11.3 u[IU]/mL (ref 2.0–19.6)

## 2014-11-12 LAB — MICROALBUMIN / CREATININE URINE RATIO
Creatinine, Urine: 26.2 mg/dL
Microalb Creat Ratio: 26.7 mg/g (ref 0.0–30.0)
Microalb, Ur: 0.7 mg/dL (ref <2.0)

## 2014-11-12 LAB — HEMOGLOBIN A1C
Hgb A1c MFr Bld: 9.3 % — ABNORMAL HIGH (ref <5.7)
MEAN PLASMA GLUCOSE: 220 mg/dL — AB (ref <117)

## 2014-11-12 LAB — MAGNESIUM: Magnesium: 1.7 mg/dL (ref 1.5–2.5)

## 2014-12-03 DIAGNOSIS — H35373 Puckering of macula, bilateral: Secondary | ICD-10-CM | POA: Diagnosis not present

## 2014-12-03 DIAGNOSIS — E11329 Type 2 diabetes mellitus with mild nonproliferative diabetic retinopathy without macular edema: Secondary | ICD-10-CM | POA: Diagnosis not present

## 2014-12-03 DIAGNOSIS — Z961 Presence of intraocular lens: Secondary | ICD-10-CM | POA: Diagnosis not present

## 2014-12-03 DIAGNOSIS — E11311 Type 2 diabetes mellitus with unspecified diabetic retinopathy with macular edema: Secondary | ICD-10-CM | POA: Diagnosis not present

## 2014-12-22 ENCOUNTER — Other Ambulatory Visit: Payer: Self-pay | Admitting: Cardiology

## 2014-12-22 DIAGNOSIS — N183 Chronic kidney disease, stage 3 (moderate): Secondary | ICD-10-CM | POA: Diagnosis not present

## 2015-01-13 ENCOUNTER — Other Ambulatory Visit: Payer: Self-pay | Admitting: Internal Medicine

## 2015-01-13 ENCOUNTER — Other Ambulatory Visit: Payer: Self-pay | Admitting: Cardiology

## 2015-02-12 DIAGNOSIS — L821 Other seborrheic keratosis: Secondary | ICD-10-CM | POA: Diagnosis not present

## 2015-02-12 DIAGNOSIS — Z85828 Personal history of other malignant neoplasm of skin: Secondary | ICD-10-CM | POA: Diagnosis not present

## 2015-02-12 DIAGNOSIS — L570 Actinic keratosis: Secondary | ICD-10-CM | POA: Diagnosis not present

## 2015-02-17 ENCOUNTER — Ambulatory Visit: Payer: Self-pay | Admitting: Internal Medicine

## 2015-02-18 ENCOUNTER — Encounter: Payer: Self-pay | Admitting: Internal Medicine

## 2015-02-18 ENCOUNTER — Ambulatory Visit (INDEPENDENT_AMBULATORY_CARE_PROVIDER_SITE_OTHER): Payer: Medicare Other | Admitting: Internal Medicine

## 2015-02-18 VITALS — BP 110/58 | HR 78 | Temp 98.2°F | Resp 18 | Ht 70.0 in | Wt 200.0 lb

## 2015-02-18 DIAGNOSIS — E785 Hyperlipidemia, unspecified: Secondary | ICD-10-CM | POA: Diagnosis not present

## 2015-02-18 DIAGNOSIS — E663 Overweight: Secondary | ICD-10-CM | POA: Diagnosis not present

## 2015-02-18 DIAGNOSIS — Z79899 Other long term (current) drug therapy: Secondary | ICD-10-CM

## 2015-02-18 DIAGNOSIS — I10 Essential (primary) hypertension: Secondary | ICD-10-CM | POA: Diagnosis not present

## 2015-02-18 DIAGNOSIS — E559 Vitamin D deficiency, unspecified: Secondary | ICD-10-CM

## 2015-02-18 DIAGNOSIS — E1122 Type 2 diabetes mellitus with diabetic chronic kidney disease: Secondary | ICD-10-CM | POA: Diagnosis not present

## 2015-02-18 DIAGNOSIS — N183 Chronic kidney disease, stage 3 unspecified: Secondary | ICD-10-CM

## 2015-02-18 DIAGNOSIS — E1129 Type 2 diabetes mellitus with other diabetic kidney complication: Secondary | ICD-10-CM | POA: Diagnosis not present

## 2015-02-18 LAB — BASIC METABOLIC PANEL WITH GFR
BUN: 41 mg/dL — AB (ref 6–23)
CALCIUM: 9.9 mg/dL (ref 8.4–10.5)
CHLORIDE: 102 meq/L (ref 96–112)
CO2: 24 meq/L (ref 19–32)
CREATININE: 1.57 mg/dL — AB (ref 0.50–1.35)
GFR, Est African American: 48 mL/min — ABNORMAL LOW
GFR, Est Non African American: 42 mL/min — ABNORMAL LOW
Glucose, Bld: 133 mg/dL — ABNORMAL HIGH (ref 70–99)
Potassium: 5 mEq/L (ref 3.5–5.3)
Sodium: 136 mEq/L (ref 135–145)

## 2015-02-18 LAB — MAGNESIUM: Magnesium: 1.9 mg/dL (ref 1.5–2.5)

## 2015-02-18 LAB — CBC WITH DIFFERENTIAL/PLATELET
Basophils Absolute: 0.1 10*3/uL (ref 0.0–0.1)
Basophils Relative: 1 % (ref 0–1)
Eosinophils Absolute: 0.5 10*3/uL (ref 0.0–0.7)
Eosinophils Relative: 8 % — ABNORMAL HIGH (ref 0–5)
HCT: 34.4 % — ABNORMAL LOW (ref 39.0–52.0)
Hemoglobin: 11.3 g/dL — ABNORMAL LOW (ref 13.0–17.0)
LYMPHS PCT: 27 % (ref 12–46)
Lymphs Abs: 1.7 10*3/uL (ref 0.7–4.0)
MCH: 30.6 pg (ref 26.0–34.0)
MCHC: 32.8 g/dL (ref 30.0–36.0)
MCV: 93.2 fL (ref 78.0–100.0)
MPV: 8.8 fL (ref 8.6–12.4)
Monocytes Absolute: 0.4 10*3/uL (ref 0.1–1.0)
Monocytes Relative: 7 % (ref 3–12)
NEUTROS ABS: 3.6 10*3/uL (ref 1.7–7.7)
NEUTROS PCT: 57 % (ref 43–77)
Platelets: 238 10*3/uL (ref 150–400)
RBC: 3.69 MIL/uL — AB (ref 4.22–5.81)
RDW: 14.5 % (ref 11.5–15.5)
WBC: 6.4 10*3/uL (ref 4.0–10.5)

## 2015-02-18 LAB — HEPATIC FUNCTION PANEL
ALBUMIN: 4.1 g/dL (ref 3.5–5.2)
ALK PHOS: 37 U/L — AB (ref 39–117)
ALT: 27 U/L (ref 0–53)
AST: 24 U/L (ref 0–37)
BILIRUBIN INDIRECT: 0.7 mg/dL (ref 0.2–1.2)
BILIRUBIN TOTAL: 0.9 mg/dL (ref 0.2–1.2)
Bilirubin, Direct: 0.2 mg/dL (ref 0.0–0.3)
Total Protein: 7.2 g/dL (ref 6.0–8.3)

## 2015-02-18 LAB — LIPID PANEL
CHOL/HDL RATIO: 3.7 ratio
CHOLESTEROL: 117 mg/dL (ref 0–200)
HDL: 32 mg/dL — AB (ref >=40)
LDL Cholesterol: 56 mg/dL (ref 0–99)
Triglycerides: 145 mg/dL (ref <150)
VLDL: 29 mg/dL (ref 0–40)

## 2015-02-18 LAB — TSH: TSH: 1.241 u[IU]/mL (ref 0.350–4.500)

## 2015-02-18 NOTE — Progress Notes (Signed)
Patient ID: Ricky Morales, male   DOB: 1935/07/31, 80 y.o.   MRN: [id number]  Assessment and Plan:  Hypertension:  -Continue medication -monitor blood pressure at home. -Continue DASH diet -Reminder to go to the ER if any CP, SOB, nausea, dizziness, severe HA, changes vision/speech, left arm numbness and tingling and jaw pain.  Cholesterol - Continue diet and exercise -Check cholesterol.   Diabetes with diabetic chronic kidney disease -Continue diet and exercise.  -Check A1C  Vitamin D Def -check level -continue medications.   Continue diet and meds as discussed. Further disposition pending results of labs. Discussed med's effects and SE's.    HPI 80 y.o. male  presents for 3 month follow up with hypertension, hyperlipidemia, diabetes and vitamin D deficiency.   His blood pressure has been controlled at home, today their BP is BP: (!) 110/58 mmHg.He does not workout. He denies chest pain, shortness of breath, dizziness.  He reports that his BP has been 110-115/50-60.     He is on cholesterol medication and denies myalgias. His cholesterol is at goal. The cholesterol was:  11/11/2014: Cholesterol 121; HDL 34*; LDL Cholesterol 65; Triglycerides 112   He has been working on diet and exercise for diabetes with diabetic chronic kidney disease, he is on bASA, he is not on ACE/ARB, and denies  foot ulcerations, hyperglycemia, hypoglycemia , increased appetite, nausea, paresthesia of the feet, polydipsia, polyuria, visual disturbances, vomiting and weight loss. Last A1C was: 11/11/2014: Hgb A1c MFr Bld 9.3*  He reports that first thing in the morning his blood sugars have been around 130.     Patient is on Vitamin D supplement. 11/11/2014: Vit D, 25-Hydroxy 66  Patient reports that he has been doing a lot better with his shortness of breath.  He feels that he is almost to the point where he can do all the activities that he wants to do.     Current Medications:  Current Outpatient  Prescriptions on File Prior to Visit  Medication Sig Dispense Refill  . ACCU-CHEK SMARTVIEW test strip Use as directed 300 each 2  . aspirin EC 81 MG tablet Take 81 mg by mouth daily.    Marland Kitchen atorvastatin (LIPITOR) 80 MG tablet TAKE ONE TABLET DAILY FOR CHOLESTEROL 90 tablet 1  . carvedilol (COREG) 25 MG tablet Take 12.5 mg by mouth 2 (two) times daily with a meal.    . carvedilol (COREG) 25 MG tablet TAKE ONE TABLET TWICE DAILY WITH A MEAL 60 tablet 6  . Cholecalciferol (VITAMIN D PO) Take 1,500 Units by mouth daily.     . clopidogrel (PLAVIX) 75 MG tablet TAKE ONE TABLET DAILY WITH BREAKFAST 30 tablet 6  . furosemide (LASIX) 20 MG tablet TAKE ONE TABLET EACH DAY AND AN EXTRA TABLET DAILY AS NEEDED IF WEIGHT INCREASES 2 lbs IN 24 HOURS OR 5 lbs IN A WEEK 60 tablet 6  . glipiZIDE (GLUCOTROL) 5 MG tablet TAKE ONE TABLET THREE TIMES DAILY 90 tablet 0  . LORazepam (ATIVAN) 2 MG tablet Take 1/2 to 1 tablet if needed for sleep 90 tablet 1  . Magnesium 250 MG TABS Take 250 mg by mouth 2 (two) times daily.    . metFORMIN (GLUCOPHAGE) 500 MG tablet Take 1 tablet (500 mg total) by mouth 2 (two) times daily with a meal.    . Multiple Vitamin (MULTIVITAMIN WITH MINERALS) TABS tablet Take 1 tablet by mouth daily.    . Omega-3 Fatty Acids (FISH OIL) 1000 MG CAPS Take  1,000 mg by mouth daily.     Marland Kitchen omeprazole (PRILOSEC) 40 MG capsule Take 40 mg by mouth daily as needed (acid reflux).     No current facility-administered medications on file prior to visit.   Medical History:  Past Medical History  Diagnosis Date  . Diabetes mellitus without complication   . Hyperlipidemia   . Hypertension   . Vitamin D deficiency   . GERD (gastroesophageal reflux disease)   . Type II or unspecified type diabetes mellitus with renal manifestations, not stated as uncontrolled    Allergies:  Allergies  Allergen Reactions  . Actos [Pioglitazone] Other (See Comments)    Excessive weight gain  . Percocet  [Oxycodone-Acetaminophen] Other (See Comments)    hallucinations     Review of Systems:  Review of Systems  Constitutional: Negative for fever, chills and malaise/fatigue.  HENT: Negative for congestion, ear pain and sore throat.   Eyes: Negative.   Respiratory: Positive for shortness of breath. Negative for cough and wheezing.   Cardiovascular: Negative for chest pain, palpitations and leg swelling.  Gastrointestinal: Negative for heartburn, nausea, vomiting, abdominal pain, diarrhea, constipation, blood in stool and melena.  Genitourinary: Negative.   Skin: Negative.   Neurological: Negative for dizziness, sensory change, loss of consciousness and headaches.  Psychiatric/Behavioral: Negative for depression. The patient is not nervous/anxious and does not have insomnia.     Family history- Review and unchanged  Social history- Review and unchanged  Physical Exam: BP 110/58 mmHg  Pulse 78  Temp(Src) 98.2 F (36.8 C) (Temporal)  Resp 18  Ht 5\' 10"  (1.778 m)  Wt 200 lb (90.719 kg)  BMI 28.70 kg/m2 Wt Readings from Last 3 Encounters:  02/18/15 200 lb (90.719 kg)  11/11/14 208 lb (94.348 kg)  10/19/14 201 lb 12.8 oz (91.536 kg)   General Appearance: Well nourished well developed, non-toxic appearing, in no apparent distress. Eyes: PERRLA, EOMs, conjunctiva no swelling or erythema ENT/Mouth: Ear canals clear with no erythema, swelling, or discharge.  TMs normal bilaterally, oropharynx clear, moist, with no exudate.   Neck: Supple, thyroid normal, no JVD, no cervical adenopathy.  Respiratory: Respiratory effort normal, breath sounds clear A&P, no wheeze, rhonchi or rales noted.  No retractions, no accessory muscle usage Cardio: RRR with no RGs. 3/6 systolic murmur heard best at left sternal border. No noted edema.  Abdomen: Soft, + BS.  Non tender, no guarding, rebound, hernias, masses. Musculoskeletal: Full ROM, 5/5 strength, Normal gait Skin: Warm, dry without rashes, lesions,  ecchymosis. Healing scabbed lesion on the bridge of the nose Neuro: Awake and oriented X 3, Cranial nerves intact. No cerebellar symptoms.  Psych: normal affect, Insight and Judgment appropriate.    FORCUCCI, Tal Neer, PA-C 10:08 AM [facility]

## 2015-02-18 NOTE — Patient Instructions (Signed)

## 2015-02-19 LAB — VITAMIN D 25 HYDROXY (VIT D DEFICIENCY, FRACTURES): Vit D, 25-Hydroxy: 66 ng/mL (ref 30–100)

## 2015-02-19 LAB — INSULIN, RANDOM: INSULIN: 9.5 u[IU]/mL (ref 2.0–19.6)

## 2015-02-19 LAB — HEMOGLOBIN A1C
HEMOGLOBIN A1C: 7.9 % — AB (ref <5.7)
Mean Plasma Glucose: 180 mg/dL — ABNORMAL HIGH (ref <117)

## 2015-03-03 ENCOUNTER — Other Ambulatory Visit: Payer: Self-pay | Admitting: Physician Assistant

## 2015-03-11 DIAGNOSIS — E11311 Type 2 diabetes mellitus with unspecified diabetic retinopathy with macular edema: Secondary | ICD-10-CM | POA: Diagnosis not present

## 2015-03-11 DIAGNOSIS — H35373 Puckering of macula, bilateral: Secondary | ICD-10-CM | POA: Diagnosis not present

## 2015-03-11 DIAGNOSIS — Z961 Presence of intraocular lens: Secondary | ICD-10-CM | POA: Diagnosis not present

## 2015-03-11 DIAGNOSIS — E11329 Type 2 diabetes mellitus with mild nonproliferative diabetic retinopathy without macular edema: Secondary | ICD-10-CM | POA: Diagnosis not present

## 2015-03-24 ENCOUNTER — Other Ambulatory Visit: Payer: Self-pay | Admitting: *Deleted

## 2015-03-24 MED ORDER — CLOPIDOGREL BISULFATE 75 MG PO TABS: 75.0000 mg | ORAL_TABLET | Freq: Every day | ORAL | Status: DC

## 2015-04-05 ENCOUNTER — Other Ambulatory Visit: Payer: Self-pay | Admitting: Internal Medicine

## 2015-04-05 DIAGNOSIS — G4700 Insomnia, unspecified: Secondary | ICD-10-CM | POA: Insufficient documentation

## 2015-04-05 DIAGNOSIS — E785 Hyperlipidemia, unspecified: Secondary | ICD-10-CM

## 2015-04-05 NOTE — Telephone Encounter (Signed)
Called Rx into [facility].

## 2015-04-29 DIAGNOSIS — N179 Acute kidney failure, unspecified: Secondary | ICD-10-CM | POA: Diagnosis not present

## 2015-04-29 DIAGNOSIS — I10 Essential (primary) hypertension: Secondary | ICD-10-CM | POA: Diagnosis not present

## 2015-04-29 DIAGNOSIS — N183 Chronic kidney disease, stage 3 (moderate): Secondary | ICD-10-CM | POA: Diagnosis not present

## 2015-04-29 DIAGNOSIS — E1129 Type 2 diabetes mellitus with other diabetic kidney complication: Secondary | ICD-10-CM | POA: Diagnosis not present

## 2015-05-24 ENCOUNTER — Other Ambulatory Visit: Payer: Self-pay | Admitting: *Deleted

## 2015-05-24 MED ORDER — GLIPIZIDE 5 MG PO TABS: 5.0000 mg | ORAL_TABLET | Freq: Three times a day (TID) | ORAL | Status: DC

## 2015-05-26 ENCOUNTER — Other Ambulatory Visit: Payer: Self-pay | Admitting: Physician Assistant

## 2015-05-26 ENCOUNTER — Encounter: Payer: Self-pay | Admitting: Internal Medicine

## 2015-05-26 ENCOUNTER — Ambulatory Visit (INDEPENDENT_AMBULATORY_CARE_PROVIDER_SITE_OTHER): Payer: Medicare Other | Admitting: Physician Assistant

## 2015-05-26 VITALS — BP 130/74 | HR 68 | Temp 97.3°F | Resp 16 | Ht 70.0 in | Wt 204.8 lb

## 2015-05-26 DIAGNOSIS — E1129 Type 2 diabetes mellitus with other diabetic kidney complication: Secondary | ICD-10-CM | POA: Diagnosis not present

## 2015-05-26 DIAGNOSIS — Z23 Encounter for immunization: Secondary | ICD-10-CM

## 2015-05-26 DIAGNOSIS — E785 Hyperlipidemia, unspecified: Secondary | ICD-10-CM | POA: Diagnosis not present

## 2015-05-26 DIAGNOSIS — D649 Anemia, unspecified: Secondary | ICD-10-CM | POA: Diagnosis not present

## 2015-05-26 DIAGNOSIS — I2510 Atherosclerotic heart disease of native coronary artery without angina pectoris: Secondary | ICD-10-CM

## 2015-05-26 DIAGNOSIS — Z79899 Other long term (current) drug therapy: Secondary | ICD-10-CM

## 2015-05-26 DIAGNOSIS — E1122 Type 2 diabetes mellitus with diabetic chronic kidney disease: Secondary | ICD-10-CM

## 2015-05-26 DIAGNOSIS — N183 Chronic kidney disease, stage 3 (moderate): Secondary | ICD-10-CM

## 2015-05-26 DIAGNOSIS — I10 Essential (primary) hypertension: Secondary | ICD-10-CM

## 2015-05-26 DIAGNOSIS — E559 Vitamin D deficiency, unspecified: Secondary | ICD-10-CM | POA: Diagnosis not present

## 2015-05-26 LAB — LIPID PANEL
CHOL/HDL RATIO: 4.7 ratio (ref <=5.0)
Cholesterol: 127 mg/dL (ref 125–200)
HDL: 27 mg/dL — AB (ref >=40)
LDL CALC: 57 mg/dL (ref <130)
TRIGLYCERIDES: 216 mg/dL — AB (ref <150)
VLDL: 43 mg/dL — ABNORMAL HIGH (ref <30)

## 2015-05-26 LAB — BASIC METABOLIC PANEL WITH GFR
BUN: 41 mg/dL — ABNORMAL HIGH (ref 7–25)
CHLORIDE: 99 mmol/L (ref 98–110)
CO2: 28 mmol/L (ref 20–31)
Calcium: 10 mg/dL (ref 8.6–10.3)
Creat: 1.28 mg/dL — ABNORMAL HIGH (ref 0.70–1.18)
GFR, EST AFRICAN AMERICAN: 62 mL/min (ref >=60)
GFR, EST NON AFRICAN AMERICAN: 53 mL/min — AB (ref >=60)
GLUCOSE: 170 mg/dL — AB (ref 65–99)
POTASSIUM: 4.5 mmol/L (ref 3.5–5.3)
Sodium: 134 mmol/L — ABNORMAL LOW (ref 135–146)

## 2015-05-26 LAB — CBC WITH DIFFERENTIAL/PLATELET
BASOS ABS: 0.1 10*3/uL (ref 0.0–0.1)
Basophils Relative: 1 % (ref 0–1)
Eosinophils Absolute: 0.5 10*3/uL (ref 0.0–0.7)
Eosinophils Relative: 6 % — ABNORMAL HIGH (ref 0–5)
HCT: 33.2 % — ABNORMAL LOW (ref 39.0–52.0)
Hemoglobin: 10.8 g/dL — ABNORMAL LOW (ref 13.0–17.0)
LYMPHS PCT: 25 % (ref 12–46)
Lymphs Abs: 1.9 10*3/uL (ref 0.7–4.0)
MCH: 30.6 pg (ref 26.0–34.0)
MCHC: 32.5 g/dL (ref 30.0–36.0)
MCV: 94.1 fL (ref 78.0–100.0)
MPV: 8.9 fL (ref 8.6–12.4)
Monocytes Absolute: 0.5 10*3/uL (ref 0.1–1.0)
Monocytes Relative: 6 % (ref 3–12)
NEUTROS PCT: 62 % (ref 43–77)
Neutro Abs: 4.8 10*3/uL (ref 1.7–7.7)
Platelets: 255 10*3/uL (ref 150–400)
RBC: 3.53 MIL/uL — AB (ref 4.22–5.81)
RDW: 15.6 % — ABNORMAL HIGH (ref 11.5–15.5)
WBC: 7.7 10*3/uL (ref 4.0–10.5)

## 2015-05-26 LAB — HEPATIC FUNCTION PANEL
ALBUMIN: 4.1 g/dL (ref 3.6–5.1)
ALK PHOS: 34 U/L — AB (ref 40–115)
ALT: 23 U/L (ref 9–46)
AST: 20 U/L (ref 10–35)
BILIRUBIN INDIRECT: 0.5 mg/dL (ref 0.2–1.2)
Bilirubin, Direct: 0.2 mg/dL (ref <=0.2)
TOTAL PROTEIN: 7.1 g/dL (ref 6.1–8.1)
Total Bilirubin: 0.7 mg/dL (ref 0.2–1.2)

## 2015-05-26 LAB — MAGNESIUM: Magnesium: 1.8 mg/dL (ref 1.5–2.5)

## 2015-05-26 NOTE — Progress Notes (Signed)
Assessment and Plan:  1. Hypertension -Continue medication, monitor blood pressure at home. Continue DASH diet.  Reminder to go to the ER if any CP, SOB, nausea, dizziness, severe HA, changes vision/speech, left arm numbness and tingling and jaw pain.  2. Cholesterol -Continue diet and exercise. Check cholesterol.   3. Prediabetes  -Continue diet and exercise. Check A1C  4. Vitamin D Def - check level and continue medications.   1. Essential hypertension - continue medications, DASH diet, exercise and monitor at home. Call if greater than 130/80.  - CBC with Differential - BASIC METABOLIC PANEL WITH GFR - Hepatic function panel - TSH  2. ASHD/CABG Control blood pressure, cholesterol, glucose, increase exercise.  Continue cardio follow up  3. Aortic stenosis Continue cardio follow up  4. Vitamin D deficiency - Vit D  25 hydroxy (rtn osteoporosis monitoring)  5. CKD stage 3 due to type 2 diabetes mellitus Discussed general issues about diabetes pathophysiology and management., Educational material distributed., Suggested low cholesterol diet., Encouraged aerobic exercise., Discussed foot care., Reminded to get yearly retinal exam. - Hemoglobin A1c - HM DIABETES FOOT EXAM  6. Hyperlipidemia -continue medications, check lipids, decrease fatty foods, increase activity. - Lipid panel   Continue diet and meds as discussed. Further disposition pending results of labs. Over 30 minutes of exam, counseling, chart review, and critical decision making was performed  HPI 80 y.o. male  presents for 3 month follow up on hypertension, cholesterol, prediabetes, and vitamin D deficiency.   His blood pressure has been controlled at home, today their BP is BP: 130/74 mmHg  He does not workout. He denies chest pain,  Dizziness. Has DOE/fatigue with exertion but this is unchanged and improved for him.  He has a history of CAD due to DM, had PTCA in 1991 and a CABG in 2003. He has IDCMP as  well with EF 55% after medical optimization, follows with cardio, Dr. Harrington Challenger. Weight is stable.  His nephrologist stopped the enalapril.   He is on cholesterol medication and denies myalgias. His cholesterol is at goal. The cholesterol last visit was:   Lab Results  Component Value Date   CHOL 117 02/18/2015   HDL 32* 02/18/2015   LDLCALC 56 02/18/2015   TRIG 145 02/18/2015   CHOLHDL 3.7 02/18/2015    He has been working on diet and exercise for diabetes with CKD and CAD, sugar in the AM will be 130-140, he is on bASA/plavix, he is not on ACE/ARB due to kidney function, he is on glipizide twice a day, and denies polydipsia, polyuria and visual disturbances. Last A1C in the office was:  Lab Results  Component Value Date   HGBA1C 7.9* 02/18/2015   Patient is on Vitamin D supplement.   Lab Results  Component Value Date   VD25OH 66 02/18/2015      Current Medications:  Current Outpatient Prescriptions on File Prior to Visit  Medication Sig Dispense Refill  . ACCU-CHEK SMARTVIEW test strip Use as directed 300 each 2  . aspirin EC 81 MG tablet Take 81 mg by mouth daily.    Marland Kitchen atorvastatin (LIPITOR) 80 MG tablet TAKE ONE TABLET EACH DAY FOR CHOLESTEROL 90 tablet 1  . carvedilol (COREG) 25 MG tablet Take 12.5 mg by mouth 2 (two) times daily with a meal.    . Cholecalciferol (VITAMIN D PO) Take 1,500 Units by mouth daily.     . clopidogrel (PLAVIX) 75 MG tablet Take 1 tablet (75 mg total) by mouth daily.  NEED OV 60 tablet 1  . furosemide (LASIX) 20 MG tablet TAKE ONE TABLET EACH DAY AND AN EXTRA TABLET DAILY AS NEEDED IF WEIGHT INCREASES 2 lbs IN 24 HOURS OR 5 lbs IN A WEEK 60 tablet 6  . glipiZIDE (GLUCOTROL) 5 MG tablet Take 1 tablet (5 mg total) by mouth 3 (three) times daily. 90 tablet 2  . LORazepam (ATIVAN) 2 MG tablet TAKE 1/2 TO 1 TABLET AT BEDTIME AS NEEDED FOR SLEEP 90 tablet 1  . Magnesium 250 MG TABS Take 250 mg by mouth 2 (two) times daily.    . metFORMIN (GLUCOPHAGE) 500 MG tablet  Take 1 tablet (500 mg total) by mouth 2 (two) times daily with a meal.    . Multiple Vitamin (MULTIVITAMIN WITH MINERALS) TABS tablet Take 1 tablet by mouth daily.    . Omega-3 Fatty Acids (FISH OIL) 1000 MG CAPS Take 1,000 mg by mouth daily.     Marland Kitchen omeprazole (PRILOSEC) 40 MG capsule Take 40 mg by mouth daily as needed (acid reflux).     No current facility-administered medications on file prior to visit.   Medical History:  Past Medical History  Diagnosis Date  . Diabetes mellitus without complication   . Hyperlipidemia   . Hypertension   . Vitamin D deficiency   . GERD (gastroesophageal reflux disease)   . Type II or unspecified type diabetes mellitus with renal manifestations, not stated as uncontrolled    Allergies:  Allergies  Allergen Reactions  . Actos [Pioglitazone] Other (See Comments)    Excessive weight gain  . Percocet [Oxycodone-Acetaminophen] Other (See Comments)    hallucinations     Review of Systems:  Review of Systems  Constitutional: Positive for malaise/fatigue. Negative for fever, chills, weight loss and diaphoresis.  HENT: Negative.   Eyes: Negative.   Respiratory: Positive for shortness of breath. Negative for cough, hemoptysis, sputum production and wheezing.   Cardiovascular: Negative for chest pain, palpitations, orthopnea, claudication, leg swelling and PND.  Gastrointestinal: Negative.   Genitourinary: Negative.   Musculoskeletal: Positive for back pain and joint pain. Negative for myalgias, falls and neck pain.  Skin: Negative.   Neurological: Negative.  Negative for weakness.  Psychiatric/Behavioral: Negative.     Family history- Review and unchanged Social history- Review and unchanged Physical Exam: BP 130/74 mmHg  Pulse 68  Temp(Src) 97.3 F (36.3 C)  Resp 16  Ht 5\' 10"  (1.778 m)  Wt 204 lb 12.8 oz (92.897 kg)  BMI 29.39 kg/m2 Wt Readings from Last 3 Encounters:  05/26/15 204 lb 12.8 oz (92.897 kg)  02/18/15 200 lb (90.719 kg)   11/11/14 208 lb (94.348 kg)   HEENT: normocephalic, sclerae anicteric, bilateral cerumen impaction nares patent, no discharge or erythema, pharynx normal Oral cavity: MMM, no lesions Neck: supple, no lymphadenopathy, no thyromegaly, no masses Heart: RRR, normal S1, S2, 3/6 systolic murmur best at LSB Lungs: CTA bilaterally, no wheezes, rhonchi, or rales Abdomen: +bs, soft, non tender, non distended, no masses, no hepatomegaly, no splenomegaly Musculoskeletal: nontender, no swelling, no obvious deformity Extremities: no edema, no cyanosis, no clubbing Pulses: 2+ symmetric, upper and lower extremities, normal cap refill Neurological: alert, oriented x 3, CN2-12 intact, strength normal upper extremities and lower extremities, sensation normal throughout, DTRs 2+ throughout, no cerebellar signs, gait normal Psychiatric: normal affect, behavior normal, pleasant    Vicie Mutters, PA-C 10:38 AM [facility]

## 2015-05-26 NOTE — Patient Instructions (Signed)
Diabetes is a very complicated disease...lets simplify it.  An easy way to look at it to understand the complications is if you think of the extra sugar floating in your blood stream as glass shards floating through your blood stream.    Diabetes affects your small vessels first: 1) The glass shards (sugar) scraps down the tiny blood vessels in your eyes and lead to diabetic retinopathy, the leading cause of blindness in the [place]. Diabetes is the leading cause of newly diagnosed adult (20 to 78 years of age) blindness in the [place].  2) The glass shards scratches down the tiny vessels of your legs leading to nerve damage called neuropathy and can lead to amputations of your feet. More than 60% of all non-traumatic amputations of lower limbs occur in people with diabetes.  3) Over time the small vessels in your brain are shredded and closed off, individually this does not cause any problems but over a long period of time many of the small vessels being blocked can lead to Vascular Dementia.   4) Your kidney's are a filter system and have a "net" that keeps certain things in the body and lets bad things out. Sugar shreds this net and leads to kidney damage and eventually failure. Decreasing the sugar that is destroying the net and certain blood pressure medications can help stop or decrease progression of kidney disease. Diabetes was the primary cause of kidney failure in 44 percent of all new cases in 2011.  5) Diabetes also destroys the small vessels in your penis that lead to erectile dysfunction. Eventually the vessels are so damaged that you may not be responsive to cialis or viagra.   Diabetes and your large vessels: Your larger vessels consist of your coronary arteries in your heart and the carotid vessels to your brain. Diabetes or even increased sugars put you at 300% increased risk of heart attack and stroke and this is why.. The sugar scrapes down your large blood vessels and your body  sees this as an internal injury and tries to repair itself. Just like you get a scab on your skin, your platelets will stick to the blood vessel wall trying to heal it. This is why we have diabetics on low dose aspirin daily, this prevents the platelets from sticking and can prevent plaque formation. In addition, your body takes cholesterol and tries to shove it into the open wound. This is why we want your LDL, or bad cholesterol, below 70.   The combination of platelets and cholesterol over 5-10 years forms plaque that can break off and cause a heart attack or stroke.   PLEASE REMEMBER:  Diabetes is preventable! Up to 85 percent of complications and morbidities among individuals with type 2 diabetes can be prevented, delayed, or effectively treated and minimized with regular visits to a health professional, appropriate monitoring and medication, and a healthy diet and lifestyle.     Bad carbs also include fruit juice, alcohol, and sweet tea. These are empty calories that do not signal to your brain that you are full.   Please remember the good carbs are still carbs which convert into sugar. So please measure them out no more than 1/2-1 cup of rice, oatmeal, pasta, and beans  Veggies are however free foods! Pile them on.   Not all fruit is created equal. Please see the list below, the fruit at the bottom is higher in sugars than the fruit at the top. Please avoid all dried fruits.       Somogyi effect The brain needs two things: oxygen and sugar. If the blood sugar level drops too low in the early morning hours, hormones (such as growth hormone, cortisol, and catecholamines) are released to make sure you brain can still function. These help reverse the low blood sugar level but may lead to blood sugar levels that are higher than normal in the morning. This is common for patient that take insulin at night or do not ear regular snacks.  Please schedule to get up in the middle of the night to check  your blood sugar.   This may be happening to you. Please eat a high protein night time snack and we will be decreasing your night time insulin as follows:

## 2015-05-26 NOTE — Addendum Note (Signed)
Addended by: Melbourne Abts C on: 05/26/2015 12:47 PM   Modules accepted: Orders

## 2015-05-27 LAB — HEMOGLOBIN A1C
Hgb A1c MFr Bld: 7.9 % — ABNORMAL HIGH (ref <5.7)
Mean Plasma Glucose: 180 mg/dL — ABNORMAL HIGH (ref <117)

## 2015-05-27 LAB — TSH: TSH: 1.836 u[IU]/mL (ref 0.350–4.500)

## 2015-05-27 LAB — IRON AND TIBC
%SAT: 20 % (ref 15–60)
IRON: 72 ug/dL (ref 50–180)
TIBC: 355 ug/dL (ref 250–425)
UIBC: 283 ug/dL (ref 125–400)

## 2015-05-27 LAB — VITAMIN B12: Vitamin B-12: 470 pg/mL (ref 211–911)

## 2015-05-27 LAB — VITAMIN D 25 HYDROXY (VIT D DEFICIENCY, FRACTURES): VIT D 25 HYDROXY: 61 ng/mL (ref 30–100)

## 2015-05-27 LAB — FERRITIN: FERRITIN: 89 ng/mL (ref 22–322)

## 2015-06-07 IMAGING — US US RENAL
1 series · 14 of 25 positions shown · non-contrast
Comparison: 09/27/2012

CLINICAL DATA: Acute kidney injury

EXAM:
RENAL/URINARY TRACT ULTRASOUND COMPLETE

[Series 1: us renal · 0.30mm/px · 14 of 39 slices shown]
[im 1/39]
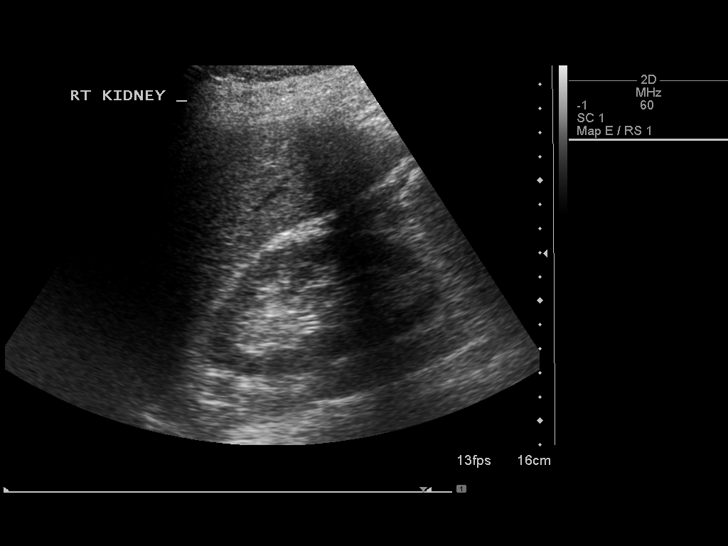
[im 4/39]
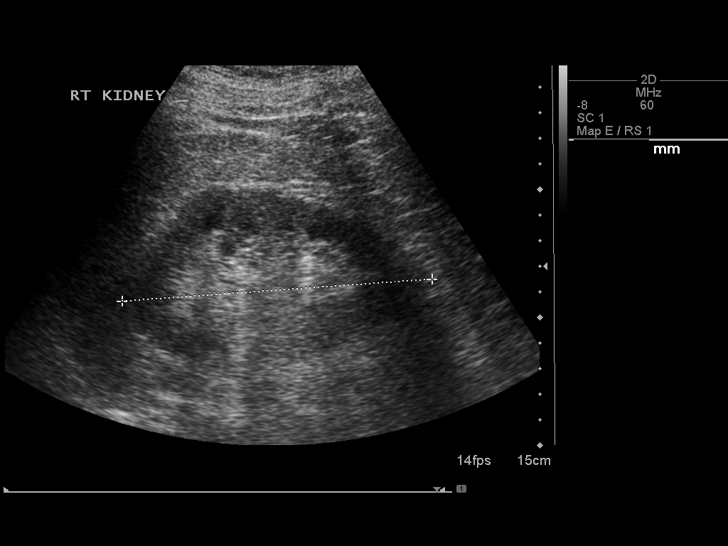
[im 7/39]
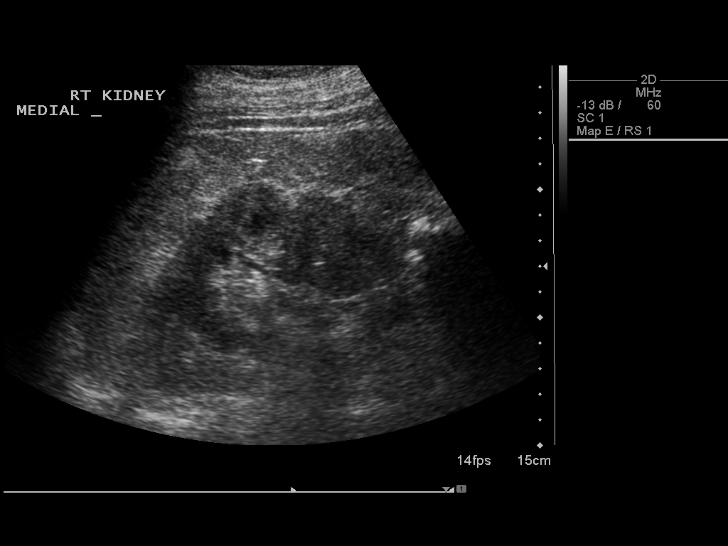
[im 10/39]
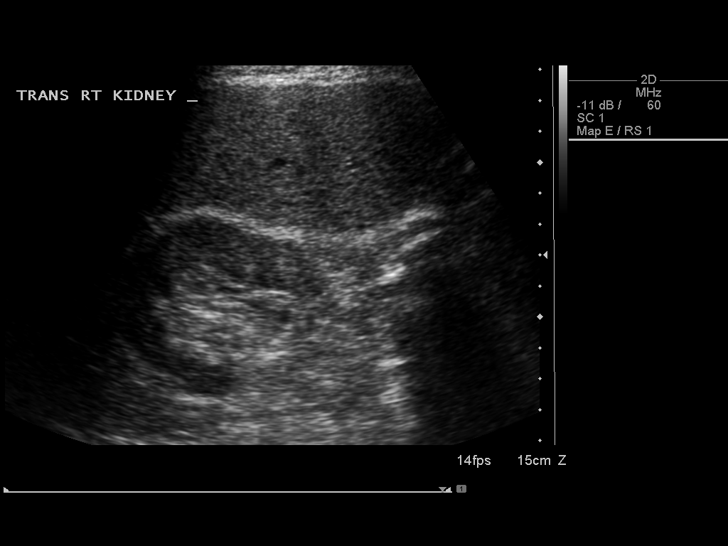
[im 13/39]
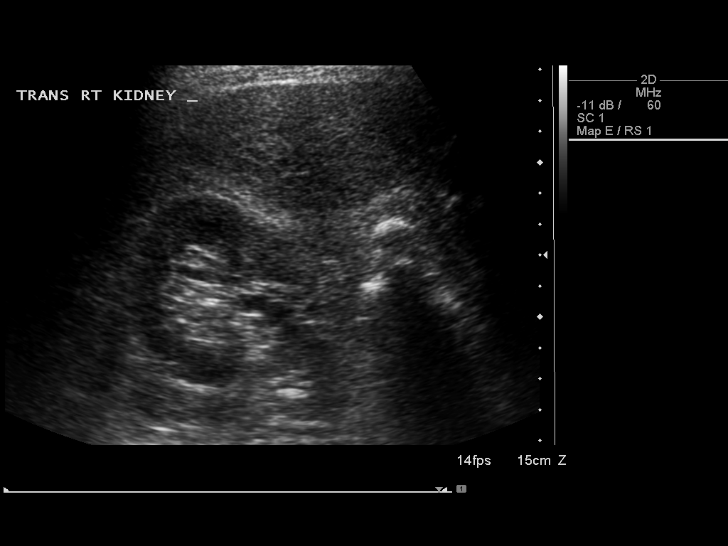
[im 15/39]
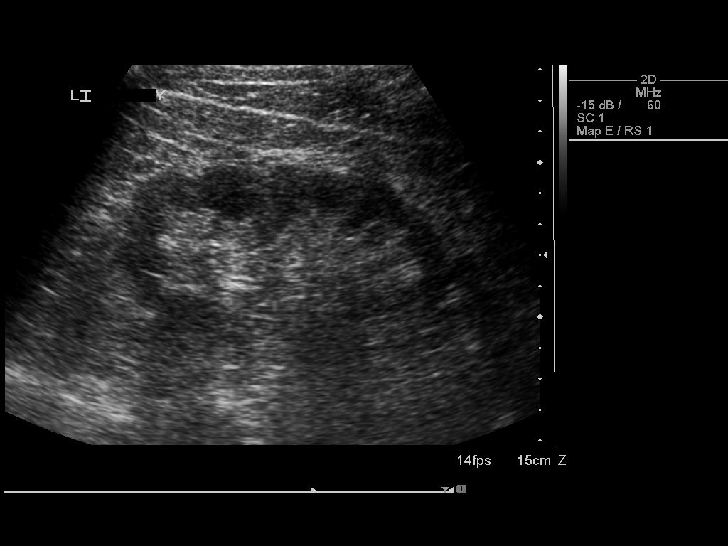
[im 18/39]
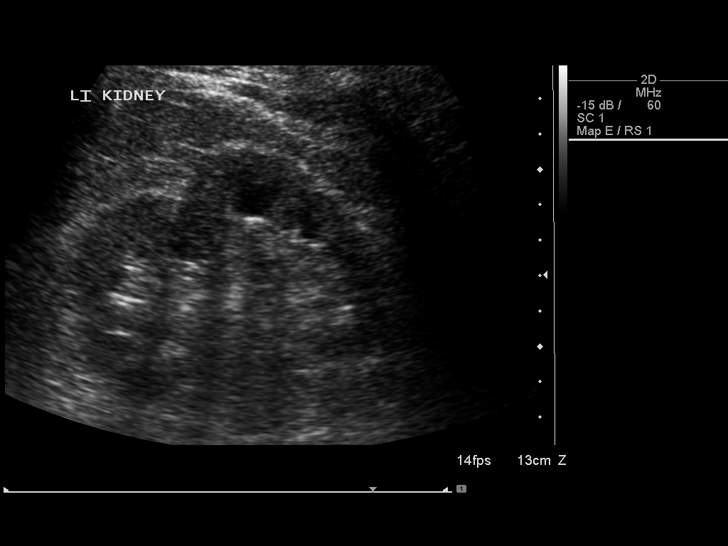
[im 21/39]
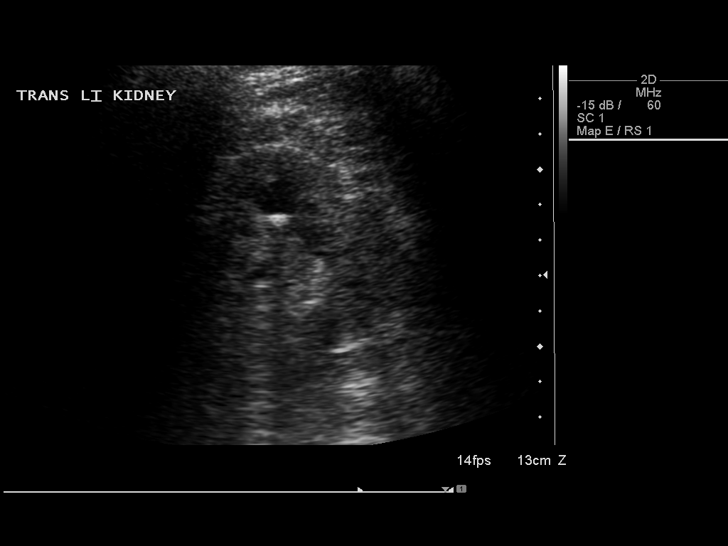
[im 24/39]
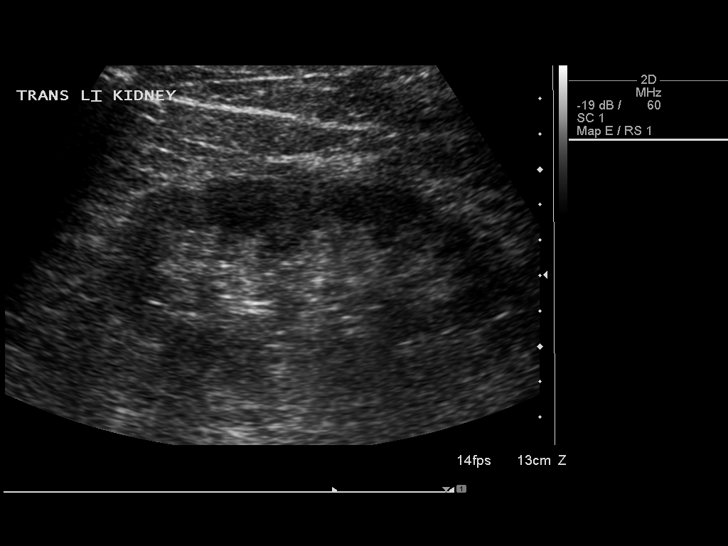
[im 26/39]
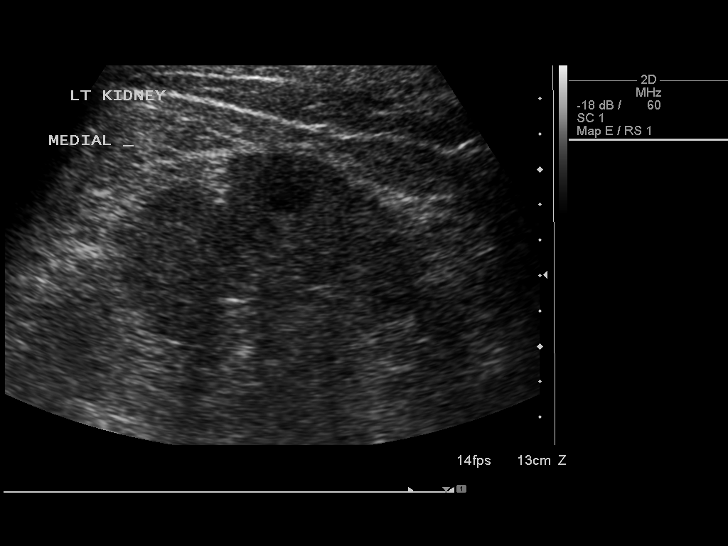
[im 29/39]
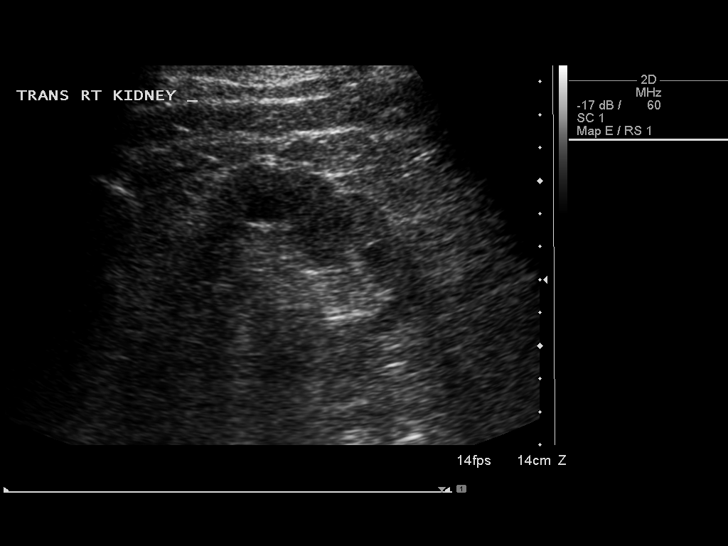
[im 32/39]
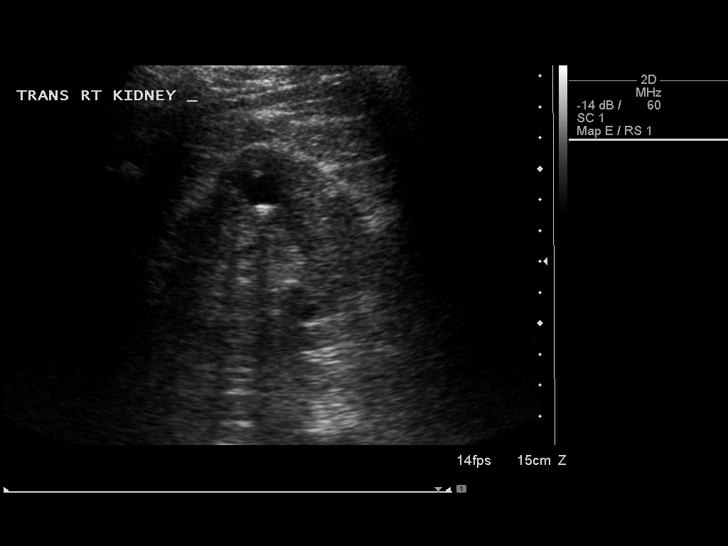
[im 35/39]
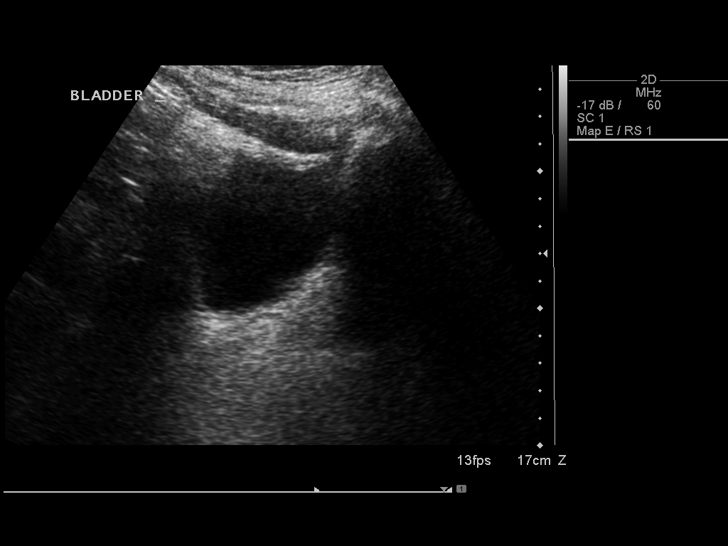
[im 39/39]
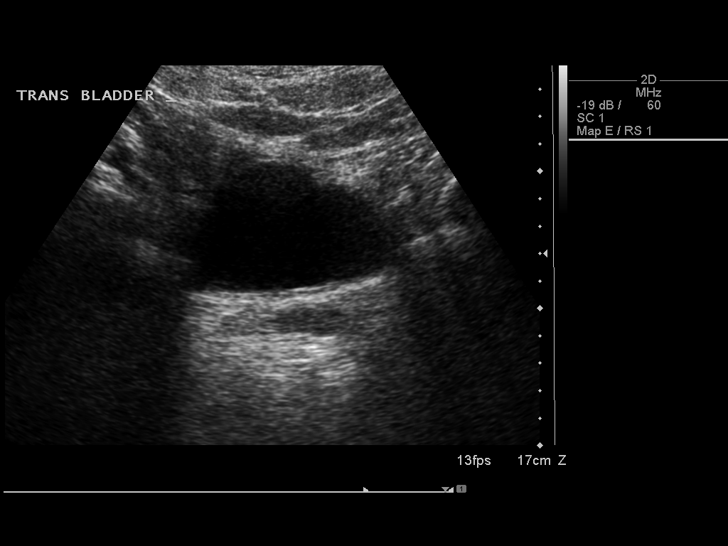

[14 of 25 positions shown; findings below may reference images not displayed]

FINDINGS: Right Kidney:

Length: 12 cm. Echogenicity within normal limits. No mass or
hydronephrosis visualized.

Left Kidney:

Length: 11 cm. There is a 14 mm anechoic lesion in the interpolar
region consistent with cyst. Along the deep wall is an echogenic
shadowing focus consistent with calcification. Appearance favors
layering milk of calcium or a stone obstructing a caylix. Finding is
unchanged from 5901. No hydronephrosis.

Bladder:

Appears normal for degree of bladder distention.
IMPRESSION: No new or acute findings to explain renal failure.

## 2015-06-23 ENCOUNTER — Other Ambulatory Visit: Payer: Self-pay | Admitting: Emergency Medicine

## 2015-07-15 ENCOUNTER — Other Ambulatory Visit: Payer: Self-pay | Admitting: *Deleted

## 2015-07-15 DIAGNOSIS — E785 Hyperlipidemia, unspecified: Secondary | ICD-10-CM

## 2015-07-15 MED ORDER — FUROSEMIDE 20 MG PO TABS: ORAL_TABLET | ORAL | Status: DC

## 2015-07-15 MED ORDER — GLUCOSE BLOOD VI STRP: ORAL_STRIP | Status: AC

## 2015-07-15 MED ORDER — ATORVASTATIN CALCIUM 80 MG PO TABS: ORAL_TABLET | ORAL | Status: DC

## 2015-07-15 MED ORDER — CLOPIDOGREL BISULFATE 75 MG PO TABS: 75.0000 mg | ORAL_TABLET | Freq: Every day | ORAL | Status: DC

## 2015-07-15 MED ORDER — METFORMIN HCL 500 MG PO TABS: 500.0000 mg | ORAL_TABLET | Freq: Two times a day (BID) | ORAL | Status: DC

## 2015-07-15 MED ORDER — GLIPIZIDE 5 MG PO TABS: 5.0000 mg | ORAL_TABLET | Freq: Three times a day (TID) | ORAL | Status: DC

## 2015-07-15 MED ORDER — CARVEDILOL 25 MG PO TABS: ORAL_TABLET | ORAL | Status: DC

## 2015-07-26 ENCOUNTER — Ambulatory Visit (HOSPITAL_COMMUNITY): Payer: Medicare Other | Attending: Internal Medicine

## 2015-07-26 DIAGNOSIS — R0989 Other specified symptoms and signs involving the circulatory and respiratory systems: Secondary | ICD-10-CM

## 2015-07-29 ENCOUNTER — Ambulatory Visit: Payer: Medicare Other | Admitting: Internal Medicine

## 2015-07-29 ENCOUNTER — Other Ambulatory Visit: Payer: Self-pay | Admitting: Internal Medicine

## 2015-07-29 DIAGNOSIS — I350 Nonrheumatic aortic (valve) stenosis: Secondary | ICD-10-CM

## 2015-07-30 ENCOUNTER — Ambulatory Visit: Payer: Medicare Other | Admitting: Internal Medicine

## 2015-08-02 ENCOUNTER — Ambulatory Visit (HOSPITAL_COMMUNITY): Payer: Medicare Other | Attending: Cardiology

## 2015-08-02 ENCOUNTER — Other Ambulatory Visit: Payer: Self-pay

## 2015-08-02 DIAGNOSIS — I352 Nonrheumatic aortic (valve) stenosis with insufficiency: Secondary | ICD-10-CM | POA: Insufficient documentation

## 2015-08-02 DIAGNOSIS — R29898 Other symptoms and signs involving the musculoskeletal system: Secondary | ICD-10-CM | POA: Diagnosis not present

## 2015-08-02 DIAGNOSIS — I340 Nonrheumatic mitral (valve) insufficiency: Secondary | ICD-10-CM | POA: Diagnosis not present

## 2015-08-02 DIAGNOSIS — I129 Hypertensive chronic kidney disease with stage 1 through stage 4 chronic kidney disease, or unspecified chronic kidney disease: Secondary | ICD-10-CM | POA: Diagnosis not present

## 2015-08-02 DIAGNOSIS — N183 Chronic kidney disease, stage 3 (moderate): Secondary | ICD-10-CM | POA: Insufficient documentation

## 2015-08-02 DIAGNOSIS — Z951 Presence of aortocoronary bypass graft: Secondary | ICD-10-CM | POA: Diagnosis not present

## 2015-08-02 DIAGNOSIS — E785 Hyperlipidemia, unspecified: Secondary | ICD-10-CM | POA: Diagnosis not present

## 2015-08-02 DIAGNOSIS — I350 Nonrheumatic aortic (valve) stenosis: Secondary | ICD-10-CM | POA: Diagnosis not present

## 2015-08-02 DIAGNOSIS — F17200 Nicotine dependence, unspecified, uncomplicated: Secondary | ICD-10-CM | POA: Insufficient documentation

## 2015-08-02 DIAGNOSIS — I517 Cardiomegaly: Secondary | ICD-10-CM | POA: Insufficient documentation

## 2015-08-02 DIAGNOSIS — D509 Iron deficiency anemia, unspecified: Secondary | ICD-10-CM | POA: Diagnosis not present

## 2015-08-05 DIAGNOSIS — E11311 Type 2 diabetes mellitus with unspecified diabetic retinopathy with macular edema: Secondary | ICD-10-CM | POA: Diagnosis not present

## 2015-08-05 DIAGNOSIS — H35373 Puckering of macula, bilateral: Secondary | ICD-10-CM | POA: Diagnosis not present

## 2015-08-05 DIAGNOSIS — Z961 Presence of intraocular lens: Secondary | ICD-10-CM | POA: Diagnosis not present

## 2015-08-05 DIAGNOSIS — E113313 Type 2 diabetes mellitus with moderate nonproliferative diabetic retinopathy with macular edema, bilateral: Secondary | ICD-10-CM | POA: Diagnosis not present

## 2015-08-17 ENCOUNTER — Ambulatory Visit (INDEPENDENT_AMBULATORY_CARE_PROVIDER_SITE_OTHER): Payer: Medicare Other | Admitting: Internal Medicine

## 2015-08-17 ENCOUNTER — Encounter: Payer: Self-pay | Admitting: Internal Medicine

## 2015-08-17 VITALS — BP 118/66 | HR 72 | Temp 97.5°F | Resp 16 | Ht 70.0 in | Wt 214.0 lb

## 2015-08-17 DIAGNOSIS — J0140 Acute pansinusitis, unspecified: Secondary | ICD-10-CM | POA: Diagnosis not present

## 2015-08-17 DIAGNOSIS — J0410 Acute tracheitis without obstruction: Secondary | ICD-10-CM | POA: Diagnosis not present

## 2015-08-17 DIAGNOSIS — M25512 Pain in left shoulder: Secondary | ICD-10-CM | POA: Diagnosis not present

## 2015-08-17 MED ORDER — PREDNISONE 20 MG PO TABS: ORAL_TABLET | ORAL | Status: DC

## 2015-08-17 MED ORDER — AZITHROMYCIN 250 MG PO TABS: ORAL_TABLET | ORAL | Status: DC

## 2015-08-17 MED ORDER — PROMETHAZINE-DM 6.25-15 MG/5ML PO SYRP: ORAL_SOLUTION | ORAL | Status: DC

## 2015-08-17 NOTE — Patient Instructions (Signed)
If glucose go over 200 or 300   May increase Metformin 500 mg up to 2 tablets 2 x day - meals  And Glipizide 5 mg up to 2 tablets 3 x da - meals   +++++++++++++++++++++++++++++++++++++++++++  Rotator Cuff Injury Rotator cuff injury is any type of injury to the set of muscles and tendons that make up the stabilizing unit of your shoulder. This unit holds the ball of your upper arm bone (humerus) in the socket of your shoulder blade (scapula).  CAUSES Injuries to your rotator cuff most commonly come from sports or activities that cause your arm to be moved repeatedly over your head. Examples of this include throwing, weight lifting, swimming, or racquet sports. Long lasting (chronic) irritation of your rotator cuff can cause soreness and swelling (inflammation), bursitis, and eventual damage to your tendons, such as a tear (rupture). SIGNS AND SYMPTOMS Acute rotator cuff tear:  Sudden tearing sensation followed by severe pain shooting from your upper shoulder down your arm toward your elbow.  Decreased range of motion of your shoulder because of pain and muscle spasm.  Severe pain.  Inability to raise your arm out to the side because of pain and loss of muscle power (large tears). Chronic rotator cuff tear:  Pain that usually is worse at night and may interfere with sleep.  Gradual weakness and decreased shoulder motion as the pain worsens.  Decreased range of motion. Rotator cuff tendinitis:  Deep ache in your shoulder and the outside upper arm over your shoulder.  Pain that comes on gradually and becomes worse when lifting your arm to the side or turning it inward. DIAGNOSIS Rotator cuff injury is diagnosed through a medical history, physical exam, and imaging exam. The medical history helps determine the type of rotator cuff injury. Your health care provider will look at your injured shoulder, feel the injured area, and ask you to move your shoulder in different positions. X-ray  exams typically are done to rule out other causes of shoulder pain, such as fractures. MRI is the exam of choice for the most severe shoulder injuries because the images show muscles and tendons.  TREATMENT  Chronic tear:  Medicine for pain, such as acetaminophen or ibuprofen.  Physical therapy and range-of-motion exercises may be helpful in maintaining shoulder function and strength.  Steroid injections into your shoulder joint.  Surgical repair of the rotator cuff if the injury does not heal with noninvasive treatment. Acute tear:  Anti-inflammatory medicines such as ibuprofen and naproxen to help reduce pain and swelling.  A sling to help support your arm and rest your rotator cuff muscles. Long-term use of a sling is not advised. It may cause significant stiffening of the shoulder joint.  Surgery may be considered within a few weeks, especially in younger, active people, to return the shoulder to full function.  Indications for surgical treatment include the following:  Age younger than 51 years.  Rotator cuff tears that are complete.  Physical therapy, rest, and anti-inflammatory medicines have been used for 6-8 weeks, with no improvement.  Employment or sporting activity that requires constant shoulder use. Tendinitis:  Anti-inflammatory medicines such as ibuprofen and naproxen to help reduce pain and swelling.  A sling to help support your arm and rest your rotator cuff muscles. Long-term use of a sling is not advised. It may cause significant stiffening of the shoulder joint.  Severe tendinitis may require:  Steroid injections into your shoulder joint.  Physical therapy.  Surgery. HOME CARE  INSTRUCTIONS   Apply ice to your injury:  Put ice in a plastic bag.  Place a towel between your skin and the bag.  Leave the ice on for 20 minutes, 2-3 times a day.  If you have a shoulder immobilizer (sling and straps), wear it until told otherwise by your health care  provider.  You may want to sleep on several pillows or in a recliner at night to lessen swelling and pain.  Only take over-the-counter or prescription medicines for pain, discomfort, or fever as directed by your health care provider.  Do simple hand squeezing exercises with a soft rubber ball to decrease hand swelling. SEEK MEDICAL CARE IF:   Your shoulder pain increases, or new pain or numbness develops in your arm, hand, or fingers.  Your hand or fingers are colder than your other hand. SEEK IMMEDIATE MEDICAL CARE IF:   Your arm, hand, or fingers are numb or tingling.  Your arm, hand, or fingers are increasingly swollen and painful, or they turn white or blue. MAKE SURE YOU:  Understand these instructions.  Will watch your condition.  Will get help right away if you are not doing well or get worse.   This information is not intended to replace advice given to you by your health care provider. Make sure you discuss any questions you have with your health care provider.   Document Released: 08/11/2000 Document Revised: 08/19/2013 Document Reviewed: 03/26/2013 Elsevier Interactive Patient Education [employer].

## 2015-08-17 NOTE — Progress Notes (Signed)
Subjective:    Patient ID: Ricky Morales, male    DOB: [birth year], 79 y.o.   MRN: [id number]  HPI Patient presents with 1 week prodrome of head/chest congestion, hoarseness, p[roductive cough, s/t and red L eye.  Also, he's is c/o new painful L shoulder.   Medication Sig  . aspirin EC 81 MG tablet Take 81 mg by mouth daily.  Marland Kitchen atorvastatin (LIPITOR) 80 MG tablet TAKE ONE TABLET EACH DAY FOR CHOLESTEROL  . carvedilol (COREG) 25 MG tablet Take /2 to 1 tablet 2 times daily as directed with a meal.  . Cholecalciferol (VITAMIN D PO) Take 1,500 Units by mouth daily.   . clopidogrel (PLAVIX) 75 MG tablet Take 1 tablet (75 mg total) by mouth daily.  . furosemide (LASIX) 20 MG tablet TAKE ONE TABLET EACH DAY AND AN EXTRA TABLET DAILY AS NEEDED IF WEIGHT INCREASES 2 lbs IN 24 HOURS OR 5 lbs IN A WEEK  . glipiZIDE (GLUCOTROL) 5 MG tablet Take 1 tablet (5 mg total) by mouth 3 (three) times daily.  Marland Kitchen glucose blood (ACCU-CHEK SMARTVIEW) test strip Use as directed  . LORazepam (ATIVAN) 2 MG tablet TAKE 1/2 TO 1 TABLET AT BEDTIME AS NEEDED FOR SLEEP  . Magnesium 250 MG TABS Take 250 mg by mouth 2 (two) times daily.  . metFORMIN (GLUCOPHAGE) 500 MG tablet Take 1 tablet (500 mg total) by mouth 2 (two) times daily with a meal.  . Multiple Vitamin (MULTIVITAMIN WITH MINERALS) TABS tablet Take 1 tablet by mouth daily.  Marland Kitchen NITROSTAT 0.4 MG SL tablet ONE TABLET UNDER TONGUE EVERY 5 MINUTES AS NEEDED FOR CHEST PAIN  . Omega-3 Fatty Acids (FISH OIL) 1000 MG CAPS Take 1,000 mg by mouth daily.   Marland Kitchen omeprazole (PRILOSEC) 40 MG capsule Take 40 mg by mouth daily as needed (acid reflux).   Allergies  Allergen Reactions  . Actos [Pioglitazone] Other (See Comments)    Excessive weight gain  . Percocet [Oxycodone-Acetaminophen] Other (See Comments)    hallucinations   Past Medical History  Diagnosis Date  . Diabetes mellitus without complication ([place])   . Hyperlipidemia   . Hypertension   . Vitamin D deficiency   .  GERD (gastroesophageal reflux disease)   . Type II or unspecified type diabetes mellitus with renal manifestations, not stated as uncontrolled    Review of Systems  10 point systems review negative except as above.    Objective:   Physical Exam  BP 118/66 mmHg  Pulse 72  Temp(Src) 97.5 F (36.4 C)  Resp 16  Ht 5\' 10"  (1.778 m)  Wt 214 lb (97.07 kg)  BMI 30.71 kg/m2  Speech hoarse. Brassy congested cough.   HEENT - Eac's patent. TM's Nl. EOM's full. L conjunctiva 2+ injected. PERRLA. NasoOroPharynx clear. Slo tender Bilat frontal/maxillary areas.  Neck - supple. Nl Thyroid. Carotids 2+ & No bruits, nodes, JVD Chest - Few scattered rales, rhonchi and no  wheezes. Cor - Nl HS. RRR w/o sig MGR. PP 1(+). No edema. MS-  decreased abduction, and internal/external rotation of the L non dominant arm.  Neuro - No obvious Cr N abnormalities. Sensory, motor and Cerebellar functions appear Nl w/o focal abnormalities. Psyche - Mental status normal & appropriate. .    Assessment & Plan:   1. Acute pansinusitis, recurrence not specified   2. Tracheitis  - promethazine-dextromethorphan (PROMETHAZINE-DM) 6.25-15 MG/5ML syrup; Take 1 to 2 tsp every 4 hours if needed for cough  Dispense: 360 mL; Refill: 1 -  predniSONE (DELTASONE) 20 MG tablet; 1 tab 3 x day for 3 days, then 1 tab 2 x day for 3 days, then 1 tab 1 x day for 5 days  Dispense: 20 tablet; Refill: 0 - azithromycin (ZITHROMAX) 250 MG tablet; Take 2 tablets (500 mg) on  Day 1,  followed by 1 tablet (250 mg) once daily on Days 2 through 5.  Dispense: 6 each; Refill: 1  3. Shoulder pain, acute, left  - predniSONE (DELTASONE) 20 MG tablet; 1 tab 3 x day for 3 days, then 1 tab 2 x day for 3 days, then 1 tab 1 x day for 5 days  Dispense: 20 tablet; Refill: 0  - precautioned that the steroids would probably elevate his glucoses and given written instruction hoe to adjust meds  - Has 1 week f/u for routine labs

## 2015-08-20 ENCOUNTER — Other Ambulatory Visit: Payer: Self-pay | Admitting: Internal Medicine

## 2015-08-26 ENCOUNTER — Ambulatory Visit (INDEPENDENT_AMBULATORY_CARE_PROVIDER_SITE_OTHER): Payer: Medicare Other | Admitting: Internal Medicine

## 2015-08-26 ENCOUNTER — Encounter: Payer: Self-pay | Admitting: Internal Medicine

## 2015-08-26 VITALS — BP 108/62 | HR 64 | Temp 97.3°F | Resp 16 | Ht 70.0 in | Wt 201.6 lb

## 2015-08-26 DIAGNOSIS — E559 Vitamin D deficiency, unspecified: Secondary | ICD-10-CM

## 2015-08-26 DIAGNOSIS — K219 Gastro-esophageal reflux disease without esophagitis: Secondary | ICD-10-CM | POA: Diagnosis not present

## 2015-08-26 DIAGNOSIS — I2510 Atherosclerotic heart disease of native coronary artery without angina pectoris: Secondary | ICD-10-CM | POA: Diagnosis not present

## 2015-08-26 DIAGNOSIS — Z79899 Other long term (current) drug therapy: Secondary | ICD-10-CM | POA: Diagnosis not present

## 2015-08-26 DIAGNOSIS — I10 Essential (primary) hypertension: Secondary | ICD-10-CM | POA: Diagnosis not present

## 2015-08-26 DIAGNOSIS — E1122 Type 2 diabetes mellitus with diabetic chronic kidney disease: Secondary | ICD-10-CM

## 2015-08-26 DIAGNOSIS — N183 Chronic kidney disease, stage 3 (moderate): Secondary | ICD-10-CM | POA: Diagnosis not present

## 2015-08-26 DIAGNOSIS — E1129 Type 2 diabetes mellitus with other diabetic kidney complication: Secondary | ICD-10-CM | POA: Diagnosis not present

## 2015-08-26 DIAGNOSIS — E785 Hyperlipidemia, unspecified: Secondary | ICD-10-CM

## 2015-08-26 LAB — CBC WITH DIFFERENTIAL/PLATELET
BASOS PCT: 1 % (ref 0–1)
Basophils Absolute: 0.1 10*3/uL (ref 0.0–0.1)
EOS ABS: 0.6 10*3/uL (ref 0.0–0.7)
Eosinophils Relative: 8 % — ABNORMAL HIGH (ref 0–5)
HCT: 32.8 % — ABNORMAL LOW (ref 39.0–52.0)
Hemoglobin: 11.1 g/dL — ABNORMAL LOW (ref 13.0–17.0)
LYMPHS ABS: 1.8 10*3/uL (ref 0.7–4.0)
Lymphocytes Relative: 23 % (ref 12–46)
MCH: 32.1 pg (ref 26.0–34.0)
MCHC: 33.8 g/dL (ref 30.0–36.0)
MCV: 94.8 fL (ref 78.0–100.0)
MONO ABS: 0.4 10*3/uL (ref 0.1–1.0)
MONOS PCT: 5 % (ref 3–12)
MPV: 8.9 fL (ref 8.6–12.4)
NEUTROS PCT: 63 % (ref 43–77)
Neutro Abs: 5 10*3/uL (ref 1.7–7.7)
PLATELETS: 328 10*3/uL (ref 150–400)
RBC: 3.46 MIL/uL — ABNORMAL LOW (ref 4.22–5.81)
RDW: 15 % (ref 11.5–15.5)
WBC: 8 10*3/uL (ref 4.0–10.5)

## 2015-08-26 LAB — LIPID PANEL
Cholesterol: 130 mg/dL (ref 125–200)
HDL: 31 mg/dL — AB (ref >=40)
LDL CALC: 56 mg/dL (ref <130)
TRIGLYCERIDES: 217 mg/dL — AB (ref <150)
Total CHOL/HDL Ratio: 4.2 Ratio (ref <=5.0)
VLDL: 43 mg/dL — AB (ref <30)

## 2015-08-26 LAB — HEPATIC FUNCTION PANEL
ALK PHOS: 40 U/L (ref 40–115)
ALT: 33 U/L (ref 9–46)
AST: 23 U/L (ref 10–35)
Albumin: 3.5 g/dL — ABNORMAL LOW (ref 3.6–5.1)
BILIRUBIN INDIRECT: 0.6 mg/dL (ref 0.2–1.2)
BILIRUBIN TOTAL: 0.8 mg/dL (ref 0.2–1.2)
Bilirubin, Direct: 0.2 mg/dL (ref <=0.2)
Total Protein: 6.6 g/dL (ref 6.1–8.1)

## 2015-08-26 LAB — BASIC METABOLIC PANEL WITH GFR
BUN: 26 mg/dL — ABNORMAL HIGH (ref 7–25)
CO2: 25 mmol/L (ref 20–31)
Calcium: 9.6 mg/dL (ref 8.6–10.3)
Chloride: 98 mmol/L (ref 98–110)
Creat: 1.36 mg/dL — ABNORMAL HIGH (ref 0.70–1.18)
GFR, EST AFRICAN AMERICAN: 57 mL/min — AB (ref >=60)
GFR, EST NON AFRICAN AMERICAN: 49 mL/min — AB (ref >=60)
Glucose, Bld: 324 mg/dL — ABNORMAL HIGH (ref 65–99)
POTASSIUM: 5.3 mmol/L (ref 3.5–5.3)
SODIUM: 132 mmol/L — AB (ref 135–146)

## 2015-08-26 LAB — MAGNESIUM: Magnesium: 1.5 mg/dL (ref 1.5–2.5)

## 2015-08-26 NOTE — Progress Notes (Signed)
Patient ID: Ricky Morales, male   DOB: 02/02/1935, 80 y.o.   MRN: [id number]   This very nice 80 y.o. MWM presents for  follow up with Hypertension, Hyperlipidemia, Pre-Diabetes and Vitamin D Deficiency. Patient was treated about a week ago with a Zpak & prednisone for a laryngotracheitis and has had some elevated CBG's since.    Patient is treated for HTN since 1991 and in 2003 he underwent CABG. Last stress test in May 29015 was Negative.  BP has been controlled at home. Today's BP: 108/62 mmHg. He reports recent negative 2D echo by his Cardiologist Dr Dorris Carnes. Patient has had no complaints of any cardiac type chest pain, palpitations, dyspnea/orthopnea/PND, dizziness, claudication, or dependent edema.   Hyperlipidemia is controlled with diet & meds. Patient denies myalgias or other med SE's. Last Lipids were at goal with Cholesterol 127; HDL 27*; LDL 57; Triglycerides 216 on 05/26/2015.   Also, the patient has history of T2_NIDDM since 2004 with Stage 3 CKD (followed by Dr Marval Regal) and has had no symptoms of reactive hypoglycemia, diabetic polys, paresthesias or visual blurring. Last A1c was not at goal with an A1c 7.9% on  05/26/2015.    Further, the patient also has history of Vitamin D Deficiency of 24 in 2008  and supplements vitamin D without any suspected side-effects. Last vitamin D was 61 on 05/26/2015.  Medication Sig  . aspirin EC 81 MG  Take 81 mg by mouth daily.  Marland Kitchen atorvastatin  80 MG  TAKE ONE TABLET EACH DAY FOR CHOLESTEROL  . carvedilol 25 MG  Take /2 to 1 tablet 2 times daily as directed with a meal.  . VITAMIN D  Take 1,500 Units by mouth daily.   . clopidogrel 75 MG  Take 1 tablet (75 mg total) by mouth daily.  . furosemide  20 MG TAKE ONE TABLET EACH DAY   . glipiZIDE  5 MG  Take 1 tablet (5 mg total) by mouth 3 (three) times daily.  Marland Kitchen LORazepam  2 MG  TAKE 1/2 TO 1 TABLET AT BEDTIME AS NEEDED FOR SLEEP  . Magnesium 250 MG  Take 250 mg by mouth 2 (two) times daily.  .  metFORMIN 500 MG  Take 1 tablet (500 mg total) by mouth 2 (two) times daily with a meal.  . MULTIVITAMIN WITH MINERALS Take 1 tablet by mouth daily.  Marland Kitchen NITROSTAT 0.4 MG SL ONE TABLET UNDER TONGUE EVERY 5 MINUTES AS NEEDED FOR CHEST PAIN  . Omega-3 FISH OIL 1000 MG  Take 1,000 mg by mouth daily.   Marland Kitchen omeprazole  40 MG  Take 40 mg by mouth daily as needed (acid reflux).   Allergies  Allergen Reactions  . Actos [Pioglitazone] Other (See Comments)    Excessive weight gain  . Percocet [Oxycodone-Acetaminophen] Other (See Comments)    hallucinations   PMHx:   Past Medical History  Diagnosis Date  . Diabetes mellitus without complication ([place])   . Hyperlipidemia   . Hypertension   . Vitamin D deficiency   . GERD (gastroesophageal reflux disease)   . Type II or unspecified type diabetes mellitus with renal manifestations, not stated as uncontrolled    Immunization History  Administered Date(s) Administered  . DT 01/30/2005, 11/11/2014  . Influenza, High Dose Seasonal PF 08/05/2014, 05/26/2015  . Influenza-Unspecified 05/10/2013  . PPD Test 09/13/2011  . Pneumococcal Conjugate-13 08/05/2014  . Pneumococcal-Unspecified 09/08/2010   Past Surgical History  Procedure Laterality Date  . Appendectomy  1955  .  Coronary artery bypass graft  2003  . Cardiac catheterization  1991, 1992, 2003  . Eye surgery  12/2006    Right CE/IOL  . Left and right heart catheterization with coronary/graft angiogram N/A 01/21/2014    Procedure: LEFT AND RIGHT HEART CATHETERIZATION WITH Beatrix Fetters;  Surgeon: Sinclair Grooms, MD;  Location: [facility] CATH LAB;  Service: Cardiovascular;  Laterality: N/A;   FHx:    Reviewed / unchanged  SHx:    Reviewed / unchanged  Systems Review:  Constitutional: Denies fever, chills, wt changes, headaches, insomnia, fatigue, night sweats, change in appetite. Eyes: Denies redness, blurred vision, diplopia, discharge, itchy, watery eyes.  ENT: Denies discharge,  congestion, post nasal drip, epistaxis, sore throat, earache, hearing loss, dental pain, tinnitus, vertigo, sinus pain, snoring.  CV: Denies chest pain, palpitations, irregular heartbeat, syncope, dyspnea, diaphoresis, orthopnea, PND, claudication or edema. Respiratory: denies cough, dyspnea, DOE, pleurisy, hoarseness, laryngitis, wheezing.  Gastrointestinal: Denies dysphagia, odynophagia, heartburn, reflux, water brash, abdominal pain or cramps, nausea, vomiting, bloating, diarrhea, constipation, hematemesis, melena, hematochezia  or hemorrhoids. Genitourinary: Denies dysuria, frequency, urgency, nocturia, hesitancy, discharge, hematuria or flank pain. Musculoskeletal: Denies arthralgias, myalgias, stiffness, jt. swelling, pain, limping or strain/sprain.  Skin: Denies pruritus, rash, hives, warts, acne, eczema or change in skin lesion(s). Neuro: No weakness, tremor, incoordination, spasms, paresthesia or pain. Psychiatric: Denies confusion, memory loss or sensory loss. Endo: Denies change in weight, skin or hair change.  Heme/Lymph: No excessive bleeding, bruising or enlarged lymph nodes.  Physical Exam  BP 108/62 mmHg  Pulse 64  Temp(Src) 97.3 F (36.3 C)  Resp 16  Ht 5\' 10"  (1.778 m)  Wt 201 lb 9.6 oz (91.445 kg)  BMI 28.93 kg/m2  Appears well nourished and in no distress. Eyes: PERRLA, EOMs, conjunctiva no swelling or erythema. Sinuses: No frontal/maxillary tenderness ENT/Mouth: EAC's clear, TM's nl w/o erythema, bulging. Nares clear w/o erythema, swelling, exudates. Oropharynx clear without erythema or exudates. Oral hygiene is good. Tongue normal, non obstructing. Hearing intact.  Neck: Supple. Thyroid nl. Car 2+/2+ without bruits, nodes or JVD. Chest: Respirations nl with BS clear & equal w/o rales, rhonchi, wheezing or stridor.  Cor: Heart sounds normal w/ regular rate and rhythm without sig. murmurs, gallops, clicks, or rubs. Peripheral pulses normal and equal  without edema.   Abdomen: Soft & bowel sounds normal. Non-tender w/o guarding, rebound, hernias, masses, or organomegaly.  Lymphatics: Unremarkable.  Musculoskeletal: Full ROM all peripheral extremities, joint stability, 5/5 strength, and normal gait.  Skin: Warm, dry without exposed rashes, lesions or ecchymosis apparent.  Neuro: Cranial nerves intact, reflexes equal bilaterally. Sensory-motor testing grossly intact. Tendon reflexes grossly intact.  Pysch: Alert & oriented x 3.  Insight and judgement nl & appropriate. No ideations.  Assessment and Plan:  1. Essential hypertension  - TSH  2. Hyperlipidemia  - Lipid panel - TSH  3. CKD stage 3 due to type 2 diabetes mellitus (HCC)  - Hemoglobin A1c - Insulin, random  4. Vitamin D deficiency  - VITAMIN D 25 Hydroxy   5. ASHD/CABG   6. Gastroesophageal reflux disease   7. Medication management  - CBC with Differential/Platelet - BASIC METABOLIC PANEL WITH GFR - Hepatic function panel - Magnesium   Recommended regular exercise, BP monitoring, weight control, and discussed med and SE's. Recommended labs to assess and monitor clinical status. Further disposition pending results of labs. Over 30 minutes of exam, counseling, chart review was performed

## 2015-08-26 NOTE — Patient Instructions (Signed)

## 2015-08-27 LAB — HEMOGLOBIN A1C
Hgb A1c MFr Bld: 9.3 % — ABNORMAL HIGH (ref <5.7)
Mean Plasma Glucose: 220 mg/dL — ABNORMAL HIGH (ref <117)

## 2015-08-27 LAB — TSH: TSH: 1.407 u[IU]/mL (ref 0.350–4.500)

## 2015-08-27 LAB — INSULIN, RANDOM: INSULIN: 33.6 u[IU]/mL — AB (ref 2.0–19.6)

## 2015-08-27 LAB — VITAMIN D 25 HYDROXY (VIT D DEFICIENCY, FRACTURES): Vit D, 25-Hydroxy: 61 ng/mL (ref 30–100)

## 2015-09-06 ENCOUNTER — Ambulatory Visit: Payer: Medicare Other | Admitting: Internal Medicine

## 2015-09-10 ENCOUNTER — Ambulatory Visit (INDEPENDENT_AMBULATORY_CARE_PROVIDER_SITE_OTHER): Payer: Medicare Other | Admitting: Cardiology

## 2015-09-10 ENCOUNTER — Encounter: Payer: Self-pay | Admitting: Cardiology

## 2015-09-10 VITALS — BP 110/52 | HR 76 | Ht 70.0 in | Wt 203.0 lb

## 2015-09-10 DIAGNOSIS — I350 Nonrheumatic aortic (valve) stenosis: Secondary | ICD-10-CM

## 2015-09-10 DIAGNOSIS — I25810 Atherosclerosis of coronary artery bypass graft(s) without angina pectoris: Secondary | ICD-10-CM | POA: Diagnosis not present

## 2015-09-10 DIAGNOSIS — E785 Hyperlipidemia, unspecified: Secondary | ICD-10-CM | POA: Diagnosis not present

## 2015-09-10 NOTE — Patient Instructions (Addendum)
Medication Instructions:  Your physician recommends that you continue on your current medications as directed. Please refer to the Current Medication list given to you today.   Labwork: NONE ORDERED  Testing/Procedures: NONE ORDERED  Follow-Up: Your physician wants you to follow-up in: 6 MONTHS WITH DR. ROSS You will receive a reminder letter in the mail two months in advance. If you don't receive a letter, please call our office to schedule the follow-up appointment.   Any Other Special Instructions Will Be Listed Below (If Applicable).     If you need a refill on your cardiac medications before your next appointment, please call your pharmacy.   

## 2015-09-10 NOTE — Progress Notes (Signed)
Cardiology Office Note   Date:  09/10/2015   ID:  Ricky Morales, DOB 08-04-1935, MRN [id number]  PCP:  Alesia Richards, MD  Cardiologist:  Dr. Harrington Challenger    Chief Complaint  Patient presents with  . Coronary Artery Disease    no complaints   . Aortic Stenosis      History of Present Illness: Ricky Morales is a 81 y.o. male who presents for his CAD and aortic stenosis, with recent Echo.   He has a history of CAD (s/p PTCA of RCA 1991; PTCA of LAD 1992) He underwent stress test then cath cath in 2003 Showed: LM 50%. LAD 70% mid; diffuse 50% mid; D1 70%; D2 50%; LCx 40%, OM2 90% RCA 40 to 505; LVEF 55% He went on to have CABG (LIMA to D2, LAD; SVG to D1; SVG to OM2; SVG to PDA)  He had chest pain in 2015 . Troponin peaked at 0.66. BNP was elevated at 2972.0. He was started on ASA, Heparin gtt, BB, Statin, NTG prn. Cath showed occlusion of all SVGs. LIMA graft is patent. Native RCA and LCx occluded as well with faint collaterals. Medical therapy was maximized. it appears that repeat revascularization would be limited by poor targets. ASA, Coreg 25 BID, statin. Plavix was also added. amloodipine was added for bette BP control and ACE-i was restarted post cath. He was given Lasix 40 mg PO and diuresed 1.4L. Lasix was decreased to 20 mg daily with an extra 20 daily as needed. Echo revealed an EF of 30-35%, grade two diastolic dysfunction, AVA 0.72cm^2  Most recent echo 08/02/15: Study Conclusions  - Left ventricle: The cavity size was normal. Systolic function was mildly to moderately reduced. The estimated ejection fraction was in the range of 40% to 45%. There is akinesis of the basal-midinferolateral and inferior myocardium. Features are consistent with a pseudonormal left ventricular filling pattern, with concomitant abnormal relaxation and increased filling pressure (grade 2 diastolic dysfunction). - Aortic valve: By mean gradient and peak velocity the AV  appears moderately stenotic but by calculated AVA it appears severly stenotic. There is mild to moderate LV dysfunction so the mean gradient and peak velocity may be underestimated. Consider dobutamine echo to assess AV gradient and AVA further. Severe diffuse thickening and calcification. There was moderate to severe regurgitation. Valve area (VTI): 0.67 cm^2. Valve area (Vmax): 0.66 cm^2. Valve area (Vmean): 0.64 cm^2. - Mitral valve: Calcified annulus. There was mild regurgitation. - Left atrium: The atrium was mildly dilated. - Atrial septum: There was increased thickness of the septum, consistent with lipomatous hypertrophy. - Pulmonary arteries: PA peak pressure: 37 mm Hg (S). Improved EF.  Today he has no complaints no chest pain and no SOB.  He is able to work in the yard for 4-5 hours without feeling tired.  If he carries fire wood up the steps he may get some SOB.   He is active.  He is eating healthy.  Recent labs with Dr. Melford Aase.     Past Medical History  Diagnosis Date  . Diabetes mellitus without complication ([place])   . Hyperlipidemia   . Hypertension   . Vitamin D deficiency   . GERD (gastroesophageal reflux disease)   . Type II or unspecified type diabetes mellitus with renal manifestations, not stated as uncontrolled     Past Surgical History  Procedure Laterality Date  . Appendectomy  1955  . Coronary artery bypass graft  2003  . Cardiac catheterization  1991, 1992,  2003  . Eye surgery  12/2006    Right CE/IOL  . Left and right heart catheterization with coronary/graft angiogram N/A 01/21/2014    Procedure: LEFT AND RIGHT HEART CATHETERIZATION WITH Beatrix Fetters;  Surgeon: Sinclair Grooms, MD;  Location: [facility] CATH LAB;  Service: Cardiovascular;  Laterality: N/A;     Current Outpatient Prescriptions  Medication Sig Dispense Refill  . aspirin EC 81 MG tablet Take 81 mg by mouth daily.    Marland Kitchen atorvastatin (LIPITOR) 80 MG tablet TAKE ONE  TABLET EACH DAY FOR CHOLESTEROL 90 tablet 1  . carvedilol (COREG) 25 MG tablet Take /2 to 1 tablet 2 times daily as directed with a meal. 90 tablet 1  . Cholecalciferol (VITAMIN D PO) Take 1,500 Units by mouth daily.     . clopidogrel (PLAVIX) 75 MG tablet Take 1 tablet (75 mg total) by mouth daily. 90 tablet 1  . furosemide (LASIX) 20 MG tablet TAKE ONE TABLET EACH DAY AND AN EXTRA TABLET DAILY AS NEEDED IF WEIGHT INCREASES 2 lbs IN 24 HOURS OR 5 lbs IN A WEEK 180 tablet 1  . glipiZIDE (GLUCOTROL) 5 MG tablet Take 1 tablet (5 mg total) by mouth 3 (three) times daily. 270 tablet 1  . glucose blood (ACCU-CHEK SMARTVIEW) test strip Use as directed 300 each 2  . LORazepam (ATIVAN) 2 MG tablet TAKE 1/2 TO 1 TABLET AT BEDTIME AS NEEDED FOR SLEEP 90 tablet 1  . Magnesium 250 MG TABS Take 250 mg by mouth 2 (two) times daily.    . metFORMIN (GLUCOPHAGE) 500 MG tablet Take 1 tablet (500 mg total) by mouth 2 (two) times daily with a meal. 180 tablet 1  . Multiple Vitamin (MULTIVITAMIN WITH MINERALS) TABS tablet Take 1 tablet by mouth daily.    Marland Kitchen NITROSTAT 0.4 MG SL tablet ONE TABLET UNDER TONGUE EVERY 5 MINUTES AS NEEDED FOR CHEST PAIN 100 tablet 1  . Omega-3 Fatty Acids (FISH OIL) 1000 MG CAPS Take 1,000 mg by mouth daily.     Marland Kitchen omeprazole (PRILOSEC) 40 MG capsule Take 40 mg by mouth daily as needed (acid reflux).     No current facility-administered medications for this visit.    Allergies:   Actos and Percocet    Social History:  The patient  reports that he quit smoking about 47 years ago. His smoking use included Cigarettes. He quit after 4 years of use. He does not have any smokeless tobacco history on file. He reports that he drinks alcohol.    ROS:  General:no colds or fevers, no weight changes Skin:no rashes or ulcers HEENT:no blurred vision, no congestion CV:see HPI PUL:see HPI GI:no diarrhea constipation or melena, no indigestion GU:no hematuria, no dysuria MS:no joint pain, no  claudication Neuro:no syncope, no lightheadedness Endo:+ diabetes is controlled though with recent cold and steroids his glucose was elevated. , no thyroid disease  Wt Readings from Last 3 Encounters:  09/10/15 203 lb (92.08 kg)  08/26/15 201 lb 9.6 oz (91.445 kg)  08/17/15 214 lb (97.07 kg)     PHYSICAL EXAM: VS:  BP 110/52 mmHg  Pulse 76  Ht 5\' 10"  (1.778 m)  Wt 203 lb (92.08 kg)  BMI 29.13 kg/m2 , BMI Body mass index is 29.13 kg/(m^2). General:Pleasant affect, NAD Skin:Warm and dry, brisk capillary refill HEENT:normocephalic, sclera clear, mucus membranes moist Neck:supple, no JVD, no bruits  Heart:S1S2 RRR with [id number] systolic aortic murmur, no gallup, rub or click Lungs:clear without rales, rhonchi, or wheezes  [id number], non tender, + BS, do not palpate liver spleen or masses Ext:no lower ext edema, 2+ pedal pulses, 2+ radial pulses Neuro:alert and oriented X 3, MAE, follows commands, + facial symmetry    EKG:  EKG is NOT ordered today.    Recent Labs: 08/26/2015: ALT 33; BUN 26*; Creat 1.36*; Hemoglobin 11.1*; Magnesium 1.5; Platelets 328; Potassium 5.3; Sodium 132*; TSH 1.407    Lipid Panel    Component Value Date/Time   CHOL 130 08/26/2015 1250   TRIG 217* 08/26/2015 1250   HDL 31* 08/26/2015 1250   CHOLHDL 4.2 08/26/2015 1250   VLDL 43* 08/26/2015 1250   LDLCALC 56 08/26/2015 1250       Other studies Reviewed: Additional studies/ records that were reviewed today include: see above and past OV..   ASSESSMENT AND PLAN:  1. CAD No symptoms to suggest angina. Keep on same regimen follow up with Dr. Harrington Challenger in 6 months unless problems prior to that time.  2. Aortic stenosis. Moderate on last echo but improved EF.  3. Hyperlipidemia followed by PCP stable.    Current medicines are reviewed with the patient today.  The patient Has no concerns regarding medicines.  The following changes have been made:  See above Labs/ tests ordered today include:see  above  Disposition:   FU:  see above  Lennie Muckle, NP  09/10/2015 4:36 PM    [facility] [place], [address], [place], [place] Phone: [phone]; Fax: [phone]

## 2015-09-24 ENCOUNTER — Other Ambulatory Visit: Payer: Self-pay | Admitting: Internal Medicine

## 2015-09-24 DIAGNOSIS — F411 Generalized anxiety disorder: Secondary | ICD-10-CM

## 2015-10-15 ENCOUNTER — Other Ambulatory Visit: Payer: Self-pay | Admitting: Internal Medicine

## 2015-11-04 ENCOUNTER — Encounter: Payer: Self-pay | Admitting: Gastroenterology

## 2015-11-30 ENCOUNTER — Encounter: Payer: Self-pay | Admitting: Internal Medicine

## 2015-11-30 ENCOUNTER — Ambulatory Visit (INDEPENDENT_AMBULATORY_CARE_PROVIDER_SITE_OTHER): Payer: Medicare Other | Admitting: Internal Medicine

## 2015-11-30 VITALS — BP 118/66 | HR 72 | Temp 97.3°F | Resp 16 | Ht 71.0 in | Wt 201.6 lb

## 2015-11-30 DIAGNOSIS — N183 Chronic kidney disease, stage 3 unspecified: Secondary | ICD-10-CM

## 2015-11-30 DIAGNOSIS — Z1212 Encounter for screening for malignant neoplasm of rectum: Secondary | ICD-10-CM

## 2015-11-30 DIAGNOSIS — Z0001 Encounter for general adult medical examination with abnormal findings: Secondary | ICD-10-CM | POA: Diagnosis not present

## 2015-11-30 DIAGNOSIS — Z79899 Other long term (current) drug therapy: Secondary | ICD-10-CM

## 2015-11-30 DIAGNOSIS — Z136 Encounter for screening for cardiovascular disorders: Secondary | ICD-10-CM | POA: Diagnosis not present

## 2015-11-30 DIAGNOSIS — I2510 Atherosclerotic heart disease of native coronary artery without angina pectoris: Secondary | ICD-10-CM | POA: Diagnosis not present

## 2015-11-30 DIAGNOSIS — D509 Iron deficiency anemia, unspecified: Secondary | ICD-10-CM | POA: Diagnosis not present

## 2015-11-30 DIAGNOSIS — K219 Gastro-esophageal reflux disease without esophagitis: Secondary | ICD-10-CM

## 2015-11-30 DIAGNOSIS — I10 Essential (primary) hypertension: Secondary | ICD-10-CM | POA: Diagnosis not present

## 2015-11-30 DIAGNOSIS — E785 Hyperlipidemia, unspecified: Secondary | ICD-10-CM

## 2015-11-30 DIAGNOSIS — E1122 Type 2 diabetes mellitus with diabetic chronic kidney disease: Secondary | ICD-10-CM

## 2015-11-30 DIAGNOSIS — E559 Vitamin D deficiency, unspecified: Secondary | ICD-10-CM | POA: Diagnosis not present

## 2015-11-30 DIAGNOSIS — M7582 Other shoulder lesions, left shoulder: Secondary | ICD-10-CM

## 2015-11-30 DIAGNOSIS — E1129 Type 2 diabetes mellitus with other diabetic kidney complication: Secondary | ICD-10-CM | POA: Diagnosis not present

## 2015-11-30 DIAGNOSIS — I350 Nonrheumatic aortic (valve) stenosis: Secondary | ICD-10-CM

## 2015-11-30 DIAGNOSIS — R6889 Other general symptoms and signs: Secondary | ICD-10-CM | POA: Diagnosis not present

## 2015-11-30 DIAGNOSIS — Z125 Encounter for screening for malignant neoplasm of prostate: Secondary | ICD-10-CM

## 2015-11-30 LAB — CBC WITH DIFFERENTIAL/PLATELET
BASOS ABS: 63 {cells}/uL (ref 0–200)
BASOS PCT: 1 %
EOS ABS: 504 {cells}/uL — AB (ref 15–500)
Eosinophils Relative: 8 %
HEMATOCRIT: 33.8 % — AB (ref 38.5–50.0)
Hemoglobin: 11 g/dL — ABNORMAL LOW (ref 13.2–17.1)
Lymphocytes Relative: 27 %
Lymphs Abs: 1701 cells/uL (ref 850–3900)
MCH: 31.1 pg (ref 27.0–33.0)
MCHC: 32.5 g/dL (ref 32.0–36.0)
MCV: 95.5 fL (ref 80.0–100.0)
MONO ABS: 441 {cells}/uL (ref 200–950)
MPV: 9 fL (ref 7.5–12.5)
Monocytes Relative: 7 %
NEUTROS ABS: 3591 {cells}/uL (ref 1500–7800)
Neutrophils Relative %: 57 %
Platelets: 236 10*3/uL (ref 140–400)
RBC: 3.54 MIL/uL — ABNORMAL LOW (ref 4.20–5.80)
RDW: 15 % (ref 11.0–15.0)
WBC: 6.3 10*3/uL (ref 3.8–10.8)

## 2015-11-30 LAB — HEPATIC FUNCTION PANEL
ALBUMIN: 4.2 g/dL (ref 3.6–5.1)
ALK PHOS: 38 U/L — AB (ref 40–115)
ALT: 23 U/L (ref 9–46)
AST: 24 U/L (ref 10–35)
Bilirubin, Direct: 0.2 mg/dL (ref <=0.2)
Indirect Bilirubin: 0.6 mg/dL (ref 0.2–1.2)
TOTAL PROTEIN: 7 g/dL (ref 6.1–8.1)
Total Bilirubin: 0.8 mg/dL (ref 0.2–1.2)

## 2015-11-30 LAB — PHOSPHORUS: Phosphorus: 4.1 mg/dL (ref 2.1–4.3)

## 2015-11-30 LAB — BASIC METABOLIC PANEL WITH GFR
BUN: 37 mg/dL — AB (ref 7–25)
CHLORIDE: 100 mmol/L (ref 98–110)
CO2: 23 mmol/L (ref 20–31)
CREATININE: 1.49 mg/dL — AB (ref 0.70–1.18)
Calcium: 9.4 mg/dL (ref 8.6–10.3)
GFR, Est African American: 51 mL/min — ABNORMAL LOW (ref >=60)
GFR, Est Non African American: 44 mL/min — ABNORMAL LOW (ref >=60)
GLUCOSE: 208 mg/dL — AB (ref 65–99)
POTASSIUM: 5.4 mmol/L — AB (ref 3.5–5.3)
Sodium: 137 mmol/L (ref 135–146)

## 2015-11-30 LAB — HEMOGLOBIN A1C
HEMOGLOBIN A1C: 8.7 % — AB (ref <5.7)
MEAN PLASMA GLUCOSE: 203 mg/dL

## 2015-11-30 LAB — LIPID PANEL
Cholesterol: 126 mg/dL (ref 125–200)
HDL: 31 mg/dL — AB (ref >=40)
LDL CALC: 66 mg/dL (ref <130)
Total CHOL/HDL Ratio: 4.1 Ratio (ref <=5.0)
Triglycerides: 144 mg/dL (ref <150)
VLDL: 29 mg/dL (ref <30)

## 2015-11-30 LAB — IRON AND TIBC
%SAT: 14 % — ABNORMAL LOW (ref 15–60)
Iron: 54 ug/dL (ref 50–180)
TIBC: 396 ug/dL (ref 250–425)
UIBC: 342 ug/dL (ref 125–400)

## 2015-11-30 LAB — MAGNESIUM: Magnesium: 2.3 mg/dL (ref 1.5–2.5)

## 2015-11-30 LAB — TSH: TSH: 1.87 m[IU]/L (ref 0.40–4.50)

## 2015-11-30 LAB — FERRITIN: Ferritin: 69 ng/mL (ref 20–380)

## 2015-11-30 MED ORDER — DEXAMETHASONE SODIUM PHOSPHATE 100 MG/10ML IJ SOLN
10.0000 mg | Freq: Once | INTRAMUSCULAR | Status: AC
  Administered 2015-11-30: 10 mg via INTRAMUSCULAR

## 2015-11-30 NOTE — Patient Instructions (Signed)

## 2015-11-30 NOTE — Progress Notes (Signed)
Patient ID: Ricky Morales, male   DOB: 1935-02-27, 81 y.o.   MRN: [id number]  Annual  Screening/Preventative Visit And Comprehensive Evaluation & Examination  This very nice 81 y.o.male MWM presents for a Wellness/Preventative Visit & comprehensive evaluation and management of multiple medical co-morbidities.  Patient has been followed for HTN, ASHD, T2_NIDDM w/CKD3, Hyperlipidemia and Vitamin D Deficiency.   HTN predates since 90. Patient's BP has been controlled at home. Patient had PTCA x 2 in [place] and then In 2003 he underwent CABG. He has most recent stress test in 2015 with Dr Dorris Carnes who follows him annually with Cecilie Kicks. Patient had recent Cardiac echo following his known moderate AoS. Today's BP: 118/66 mmHg. Patient denies any cardiac symptoms as chest pain, palpitations, shortness of breath, dizziness or ankle swelling.   Patient's hyperlipidemia is controlled with diet and medications. Patient denies myalgias or other medication SE's. Last lipids were at goal with Cholesterol 130; HDL 31*; LDL 56; but elevated Triglycerides 217 on 08/26/2015.    Patient has T2_NIDDM since 2004 with CKD 3  (GFR 47) and is followed by Dr Burman Foster and patient denies reactive hypoglycemic symptoms, visual blurring, diabetic polys or paresthesias. Last A1c was not at goal due to his non compliant dieting & snacking with A1c 9.3% on 08/26/2015.    Finally, patient has history of Vitamin D Deficiency of "24" in 2008 and last vitamin D was 61 on 08/26/2015.   Medication Sig  . aspirin EC 81 MG tablet Take 81 mg by mouth daily.  Marland Kitchen atorvastatin (LIPITOR) 80 MG tablet TAKE ONE TABLET EACH DAY FOR CHOLESTEROL  . carvedilol (COREG) 25 MG tablet Take 1 tablet by mouth two  times daily or as directed  . Cholecalciferol (VITAMIN D PO) Take 1,500 Units by mouth daily.   . clopidogrel (PLAVIX) 75 MG tablet Take 1 tablet (75 mg total) by mouth daily.  . furosemide (LASIX) 20 MG tablet TAKE ONE TABLET EACH  DAY   . glipiZIDE (GLUCOTROL) 5 MG tablet Take 1 tablet (5 mg total) by mouth 3 (three) times daily.  Marland Kitchen LORazepam  2 MG tablet TAKE 1/2 TO 1 TABLET AT BEDTIME AS NEEDED FOR SLEEP  . Magnesium 250 MG TABS Take 250 mg by mouth 2 (two) times daily.  . metFORMIN  500 MG tablet Take 1 tablet (500 mg total) by mouth 2 (two) times daily with a meal.  . Multiple Vitamin w/ MINERALS Take 1 tablet by mouth daily.  Marland Kitchen NITROSTAT 0.4 MG SL tablet ONE TABLET UNDER TONGUE EVERY 5 MINUTES AS NEEDED FOR CHEST PAIN  . Omega-3 FISH OIL 1000 MG CAPS Take 1,000 mg by mouth daily.   Marland Kitchen omeprazole 40 MG capsule Take 40 mg by mouth daily as needed (acid reflux).   Allergies  Allergen Reactions  . Actos [Pioglitazone] Other (See Comments)    Excessive weight gain  . Percocet [Oxycodone-Acetaminophen] Other (See Comments)    hallucinations   Past Medical History  Diagnosis Date  . Diabetes mellitus without complication ([place])   . Hyperlipidemia   . Hypertension   . Vitamin D deficiency   . GERD (gastroesophageal reflux disease)   . Type II or unspecified type diabetes mellitus with renal manifestations, not stated as uncontrolled    Health Maintenance  Topic Date Due  . OPHTHALMOLOGY EXAM  11/01/1946  . TETANUS/TDAP  11/01/1955  . ZOSTAVAX  10/31/1996  . FOOT EXAM  11/11/2015  . URINE MICROALBUMIN  11/11/2015  .  HEMOGLOBIN A1C  02/24/2016  . INFLUENZA VACCINE  03/28/2016  . PNA vac Low Risk Adult  Completed   Immunization History  Administered Date(s) Administered  . DT 01/30/2005, 11/11/2014  . Influenza, High Dose Seasonal PF 08/05/2014, 05/26/2015  . Influenza-Unspecified 05/10/2013  . PPD Test 09/13/2011  . Pneumococcal Conjugate-13 08/05/2014  . Pneumococcal-Unspecified 09/08/2010   Past Surgical History  Procedure Laterality Date  . Appendectomy  1955  . Coronary artery bypass graft  2003  . Cardiac catheterization  1991, 1992, 2003  . Eye surgery  12/2006    Right CE/IOL  . Left and  right heart catheterization with coronary/graft angiogram N/A 01/21/2014    Procedure: LEFT AND RIGHT HEART CATHETERIZATION WITH Beatrix Fetters;  Surgeon: Sinclair Grooms, MD;  Location[facility] CATH LAB;  Service: Cardiovascular;  Laterality: N/A;   Family History  Problem Relation Age of Onset  . Heart disease Mother   . Diabetes Mother   . Cancer Father   . Heart attack Neg Hx   . Stroke Neg Hx    Social History   Social History  . Marital Status: Married    Spouse Name: N/A  . Number of Children: N/A  . Years of Education: N/A   Occupational History  . Retired Financial controller of a [employer]  . Smoking status: Former Smoker -- 4 years    Types: Cigarettes    Quit date: 08/28/1968  . Smokeless tobacco: Not on file  . Alcohol Use: Yes     Comment: occasionally  . Drug Use: Not on file  . Sexual Activity: Not on file    ROS Constitutional: Denies fever, chills, weight loss/gain, headaches, insomnia,  night sweats or change in appetite. Does c/o fatigue. Eyes: Denies redness, blurred vision, diplopia, discharge, itchy or watery eyes.  ENT: Denies discharge, congestion, post nasal drip, epistaxis, sore throat, earache, hearing loss, dental pain, Tinnitus, Vertigo, Sinus pain or snoring.  Cardio: Denies chest pain, palpitations, irregular heartbeat, syncope, dyspnea, diaphoresis, orthopnea, PND, claudication or edema Respiratory: denies cough, dyspnea, DOE, pleurisy, hoarseness, laryngitis or wheezing.  Gastrointestinal: Denies dysphagia, heartburn, reflux, water brash, pain, cramps, nausea, vomiting, bloating, diarrhea, constipation, hematemesis, melena, hematochezia, jaundice or hemorrhoids Genitourinary: Denies dysuria, frequency, urgency, nocturia, hesitancy, discharge, hematuria or flank pain Musculoskeletal: Denies arthralgia, myalgia, stiffness, Jt. Swelling, limp or strain/sprain. C/o pain L shoulder limiting ROM. Denies Falls. Skin:  Denies puritis, rash, hives, warts, acne, eczema or change in skin lesion Neuro: No weakness, tremor, incoordination, spasms, paresthesia or pain Psychiatric: Denies confusion, memory loss or sensory loss. Denies Depression. Endocrine: Denies change in weight, skin, hair change, nocturia, and paresthesia, diabetic polys, visual blurring or hyper / hypo glycemic episodes.  Heme/Lymph: No excessive bleeding, bruising or enlarged lymph nodes.  Physical Exam  BP 118/66 mmHg  Pulse 72  Temp(Src) 97.3 F (36.3 C)  Resp 16  Ht 5\' 11"  (1.803 m)  Wt 201 lb 9.6 oz (91.445 kg)  BMI 28.13 kg/m2  General Appearance: Well nourished, in no apparent distress.  Eyes: PERRLA, EOMs, conjunctiva no swelling or erythema, normal fundi and vessels. Sinuses: No frontal/maxillary tenderness ENT/Mouth: EACs patent / TMs  nl. Nares clear without erythema, swelling, mucoid exudates. Oral hygiene is good. No erythema, swelling, or exudate. Tongue normal, non-obstructing. Tonsils not swollen or erythematous. Hearing normal.  Neck: Supple, thyroid normal. No bruits, nodes or JVD. Respiratory: Respiratory effort normal.  BS equal and clear bilateral without rales, rhonci, wheezing  or stridor. Cardio: Heart sounds are soft with regular rate and rhythm w/Gr 2-3 Ao Sys M max at RSB. Peripheral pulses are 1+ and equal bilaterally without edema. No aortic or femoral bruits. Chest: Median Sternotomy scar. Symmetric with normal excursions and percussion.  Abdomen: Soft, with Nl bowel sounds. Nontender, no guarding, rebound, hernias, masses, or organomegaly.  Lymphatics: Non tender without lymphadenopathy.  Genitourinary: Deferred by patient request Musculoskeletal: Generalized muscle power, tone and bulk. Normal gait.Decreased abduction and int/ext L shoulder ROM due to pain Skin: Warm and dry without rashes, lesions, cyanosis, clubbing or  ecchymosis.  Neuro: Cranial nerves intact, reflexes equal bilaterally. Normal muscle  tone, no cerebellar symptoms. Sensation intact.  Pysch: Alert and oriented X 3 with normal affect, insight and judgment appropriate.   Assessment and Plan  1. Annual Preventative/Screening Exam   - Microalbumin / creatinine urine ratio - EKG 12-Lead - [place], RETROPERITNL ABD,  LTD - POC Hemoccult Bld/Stl  - Urinalysis, Routine w reflex microscopic  - PSA - HM DIABETES FOOT EXAM - LOW EXTREMITY NEUR EXAM DOCUM - CBC with Differential/Platelet - BASIC METABOLIC PANEL WITH GFR - Hepatic function panel - Magnesium - Lipid panel - TSH - Hemoglobin A1c - Insulin, random   2. Essential hypertension  - EKG 12-Lead - [place], RETROPERITNL ABD,  LTD - TSH  3. Hyperlipidemia  - Lipid panel - TSH  4. CKD stage 3 due to type 2 diabetes mellitus (HCC)  - Microalbumin / creatinine urine ratio - HM DIABETES FOOT EXAM - LOW EXTREMITY NEUR EXAM DOCUM - Hemoglobin A1c - Insulin, random  5. Vitamin D deficiency  - VITAMIN D 25 Hydroxy   6. ASHD/CABG   7. Aortic stenosis   8. Gastroesophageal reflux disease   9. Screening for rectal cancer  - POC Hemoccult Bld/Stl   10. Prostate cancer screening  - PSA  11. Rotator cuff tendonitis, left  - dexamethasone (DECADRON) injection 10 mg; Inject 1 mL (10 mg total) into the muscle (L deltoid)  12. Anemia, iron deficiency  - Iron and TIBC - Ferritin - Phosphorus  13. Chronic renal disease, stage III  - PTH, intact and calcium  14. Medication management  - Urinalysis, Routine w reflex microscopic  - CBC with Differential/Platelet - BASIC METABOLIC PANEL WITH GFR - Hepatic function panel - Magnesium  15. Screening for AAA (aortic abdominal aneurysm)   16. Screening for ischemic heart disease   Continue prudent diet as discussed, weight control, BP monitoring, regular exercise, and medications as discussed.  Discussed med effects and SE's. Routine screening labs and tests as requested with regular follow-up as  recommended. Over 40 minutes of exam, counseling, chart review and high complex critical decision making was performed

## 2015-12-01 ENCOUNTER — Ambulatory Visit (INDEPENDENT_AMBULATORY_CARE_PROVIDER_SITE_OTHER): Payer: Medicare Other | Admitting: *Deleted

## 2015-12-01 DIAGNOSIS — H6123 Impacted cerumen, bilateral: Secondary | ICD-10-CM

## 2015-12-01 LAB — URINALYSIS, ROUTINE W REFLEX MICROSCOPIC
Bilirubin Urine: NEGATIVE
GLUCOSE, UA: NEGATIVE
HGB URINE DIPSTICK: NEGATIVE
Ketones, ur: NEGATIVE
LEUKOCYTES UA: NEGATIVE
NITRITE: NEGATIVE
PH: 6.5 (ref 5.0–8.0)
PROTEIN: NEGATIVE
Specific Gravity, Urine: 1.014 (ref 1.001–1.035)

## 2015-12-01 LAB — PSA: PSA: 0.37 ng/mL (ref <=4.00)

## 2015-12-01 LAB — MICROALBUMIN / CREATININE URINE RATIO
CREATININE, URINE: 57 mg/dL (ref 20–370)
MICROALB UR: 0.5 mg/dL
Microalb Creat Ratio: 9 mcg/mg creat (ref <30)

## 2015-12-01 LAB — INSULIN, RANDOM: INSULIN: 22 u[IU]/mL — AB (ref 2.0–19.6)

## 2015-12-01 LAB — PTH, INTACT AND CALCIUM
CALCIUM: 9.4 mg/dL (ref 8.4–10.5)
PTH: 31 pg/mL (ref 14–64)

## 2015-12-01 LAB — VITAMIN D 25 HYDROXY (VIT D DEFICIENCY, FRACTURES): Vit D, 25-Hydroxy: 63 ng/mL (ref 30–100)

## 2015-12-01 NOTE — Progress Notes (Signed)
Patient ID: Ricky Morales, male   DOB: [birth year], 81 y.o.   MRN: [id number] Patient presents for bilateral ear lavage with significant cerumen impaction.  Successful removal of bilateral  impaction.

## 2015-12-09 DIAGNOSIS — E113313 Type 2 diabetes mellitus with moderate nonproliferative diabetic retinopathy with macular edema, bilateral: Secondary | ICD-10-CM | POA: Diagnosis not present

## 2015-12-09 DIAGNOSIS — H35373 Puckering of macula, bilateral: Secondary | ICD-10-CM | POA: Diagnosis not present

## 2015-12-09 DIAGNOSIS — E11311 Type 2 diabetes mellitus with unspecified diabetic retinopathy with macular edema: Secondary | ICD-10-CM | POA: Diagnosis not present

## 2015-12-09 DIAGNOSIS — Z961 Presence of intraocular lens: Secondary | ICD-10-CM | POA: Diagnosis not present

## 2015-12-13 ENCOUNTER — Other Ambulatory Visit: Payer: Self-pay | Admitting: Internal Medicine

## 2015-12-24 ENCOUNTER — Other Ambulatory Visit: Payer: Self-pay | Admitting: Internal Medicine

## 2015-12-29 DIAGNOSIS — N179 Acute kidney failure, unspecified: Secondary | ICD-10-CM | POA: Diagnosis not present

## 2015-12-29 DIAGNOSIS — I10 Essential (primary) hypertension: Secondary | ICD-10-CM | POA: Diagnosis not present

## 2015-12-29 DIAGNOSIS — E785 Hyperlipidemia, unspecified: Secondary | ICD-10-CM | POA: Diagnosis not present

## 2015-12-29 DIAGNOSIS — E1129 Type 2 diabetes mellitus with other diabetic kidney complication: Secondary | ICD-10-CM | POA: Diagnosis not present

## 2015-12-29 DIAGNOSIS — N183 Chronic kidney disease, stage 3 (moderate): Secondary | ICD-10-CM | POA: Diagnosis not present

## 2016-01-22 ENCOUNTER — Other Ambulatory Visit: Payer: Self-pay | Admitting: Internal Medicine

## 2016-01-22 DIAGNOSIS — E1122 Type 2 diabetes mellitus with diabetic chronic kidney disease: Secondary | ICD-10-CM

## 2016-01-22 DIAGNOSIS — N184 Chronic kidney disease, stage 4 (severe): Principal | ICD-10-CM

## 2016-01-22 DIAGNOSIS — I150 Renovascular hypertension: Secondary | ICD-10-CM

## 2016-02-14 ENCOUNTER — Other Ambulatory Visit: Payer: Self-pay | Admitting: Internal Medicine

## 2016-02-16 DIAGNOSIS — C44311 Basal cell carcinoma of skin of nose: Secondary | ICD-10-CM | POA: Diagnosis not present

## 2016-02-16 DIAGNOSIS — L821 Other seborrheic keratosis: Secondary | ICD-10-CM | POA: Diagnosis not present

## 2016-02-16 DIAGNOSIS — L218 Other seborrheic dermatitis: Secondary | ICD-10-CM | POA: Diagnosis not present

## 2016-02-16 DIAGNOSIS — Z85828 Personal history of other malignant neoplasm of skin: Secondary | ICD-10-CM | POA: Diagnosis not present

## 2016-02-16 DIAGNOSIS — C44619 Basal cell carcinoma of skin of left upper limb, including shoulder: Secondary | ICD-10-CM | POA: Diagnosis not present

## 2016-02-16 DIAGNOSIS — D485 Neoplasm of uncertain behavior of skin: Secondary | ICD-10-CM | POA: Diagnosis not present

## 2016-02-16 DIAGNOSIS — D1801 Hemangioma of skin and subcutaneous tissue: Secondary | ICD-10-CM | POA: Diagnosis not present

## 2016-03-09 ENCOUNTER — Ambulatory Visit (INDEPENDENT_AMBULATORY_CARE_PROVIDER_SITE_OTHER): Payer: Medicare Other | Admitting: Internal Medicine

## 2016-03-09 ENCOUNTER — Encounter: Payer: Self-pay | Admitting: Internal Medicine

## 2016-03-09 VITALS — BP 130/80 | HR 65 | Temp 97.7°F | Resp 16 | Ht 70.0 in | Wt 201.4 lb

## 2016-03-09 DIAGNOSIS — F411 Generalized anxiety disorder: Secondary | ICD-10-CM | POA: Diagnosis not present

## 2016-03-09 DIAGNOSIS — H3530 Unspecified macular degeneration: Secondary | ICD-10-CM

## 2016-03-09 DIAGNOSIS — M25519 Pain in unspecified shoulder: Secondary | ICD-10-CM

## 2016-03-09 DIAGNOSIS — E559 Vitamin D deficiency, unspecified: Secondary | ICD-10-CM | POA: Diagnosis not present

## 2016-03-09 DIAGNOSIS — E1122 Type 2 diabetes mellitus with diabetic chronic kidney disease: Secondary | ICD-10-CM

## 2016-03-09 DIAGNOSIS — E785 Hyperlipidemia, unspecified: Secondary | ICD-10-CM | POA: Diagnosis not present

## 2016-03-09 DIAGNOSIS — I350 Nonrheumatic aortic (valve) stenosis: Secondary | ICD-10-CM | POA: Diagnosis not present

## 2016-03-09 DIAGNOSIS — G4700 Insomnia, unspecified: Secondary | ICD-10-CM

## 2016-03-09 DIAGNOSIS — Z0000 Encounter for general adult medical examination without abnormal findings: Secondary | ICD-10-CM

## 2016-03-09 DIAGNOSIS — I2510 Atherosclerotic heart disease of native coronary artery without angina pectoris: Secondary | ICD-10-CM

## 2016-03-09 DIAGNOSIS — Z79899 Other long term (current) drug therapy: Secondary | ICD-10-CM

## 2016-03-09 DIAGNOSIS — K219 Gastro-esophageal reflux disease without esophagitis: Secondary | ICD-10-CM | POA: Diagnosis not present

## 2016-03-09 DIAGNOSIS — Z0001 Encounter for general adult medical examination with abnormal findings: Secondary | ICD-10-CM

## 2016-03-09 DIAGNOSIS — R6889 Other general symptoms and signs: Secondary | ICD-10-CM

## 2016-03-09 DIAGNOSIS — I10 Essential (primary) hypertension: Secondary | ICD-10-CM

## 2016-03-09 DIAGNOSIS — N183 Chronic kidney disease, stage 3 unspecified: Secondary | ICD-10-CM

## 2016-03-09 LAB — URINALYSIS, ROUTINE W REFLEX MICROSCOPIC
Bilirubin Urine: NEGATIVE
GLUCOSE, UA: NEGATIVE
HGB URINE DIPSTICK: NEGATIVE
KETONES UR: NEGATIVE
Leukocytes, UA: NEGATIVE
NITRITE: NEGATIVE
PH: 6.5 (ref 5.0–8.0)
Protein, ur: NEGATIVE
SPECIFIC GRAVITY, URINE: 1.011 (ref 1.001–1.035)

## 2016-03-09 LAB — COMPREHENSIVE METABOLIC PANEL
ALK PHOS: 35 U/L — AB (ref 40–115)
ALT: 25 U/L (ref 9–46)
AST: 24 U/L (ref 10–35)
Albumin: 4.3 g/dL (ref 3.6–5.1)
BILIRUBIN TOTAL: 0.9 mg/dL (ref 0.2–1.2)
BUN: 31 mg/dL — AB (ref 7–25)
CO2: 23 mmol/L (ref 20–31)
CREATININE: 1.54 mg/dL — AB (ref 0.70–1.18)
Calcium: 9.5 mg/dL (ref 8.6–10.3)
Chloride: 99 mmol/L (ref 98–110)
GLUCOSE: 148 mg/dL — AB (ref 65–99)
POTASSIUM: 5.1 mmol/L (ref 3.5–5.3)
SODIUM: 135 mmol/L (ref 135–146)
TOTAL PROTEIN: 7.2 g/dL (ref 6.1–8.1)

## 2016-03-09 LAB — LIPID PANEL
CHOL/HDL RATIO: 3.6 ratio (ref <=5.0)
CHOLESTEROL: 122 mg/dL — AB (ref 125–200)
HDL: 34 mg/dL — AB (ref >=40)
LDL CALC: 58 mg/dL (ref <130)
TRIGLYCERIDES: 148 mg/dL (ref <150)
VLDL: 30 mg/dL (ref <30)

## 2016-03-09 LAB — CBC WITH DIFFERENTIAL/PLATELET
BASOS PCT: 1 %
Basophils Absolute: 64 cells/uL (ref 0–200)
EOS ABS: 384 {cells}/uL (ref 15–500)
Eosinophils Relative: 6 %
HEMATOCRIT: 34.3 % — AB (ref 38.5–50.0)
Hemoglobin: 11.4 g/dL — ABNORMAL LOW (ref 13.2–17.1)
Lymphocytes Relative: 24 %
Lymphs Abs: 1536 cells/uL (ref 850–3900)
MCH: 31.2 pg (ref 27.0–33.0)
MCHC: 33.2 g/dL (ref 32.0–36.0)
MCV: 94 fL (ref 80.0–100.0)
MONO ABS: 512 {cells}/uL (ref 200–950)
MONOS PCT: 8 %
MPV: 8.9 fL (ref 7.5–12.5)
NEUTROS PCT: 61 %
Neutro Abs: 3904 cells/uL (ref 1500–7800)
PLATELETS: 229 10*3/uL (ref 140–400)
RBC: 3.65 MIL/uL — ABNORMAL LOW (ref 4.20–5.80)
RDW: 14.9 % (ref 11.0–15.0)
WBC: 6.4 10*3/uL (ref 3.8–10.8)

## 2016-03-09 LAB — IRON AND TIBC
%SAT: 21 % (ref 15–60)
IRON: 76 ug/dL (ref 50–180)
TIBC: 358 ug/dL (ref 250–425)
UIBC: 282 ug/dL (ref 125–400)

## 2016-03-09 LAB — PHOSPHORUS: Phosphorus: 4.2 mg/dL (ref 2.1–4.3)

## 2016-03-09 LAB — HEMOGLOBIN A1C
Hgb A1c MFr Bld: 7.8 % — ABNORMAL HIGH (ref <5.7)
Mean Plasma Glucose: 177 mg/dL

## 2016-03-09 LAB — TSH: TSH: 1.38 m[IU]/L (ref 0.40–4.50)

## 2016-03-09 MED ORDER — DEXAMETHASONE SODIUM PHOSPHATE 100 MG/10ML IJ SOLN
10.0000 mg | Freq: Once | INTRAMUSCULAR | Status: AC
  Administered 2016-03-09: 10 mg via INTRAMUSCULAR

## 2016-03-09 MED ORDER — LORAZEPAM 2 MG PO TABS: 2.0000 mg | ORAL_TABLET | Freq: Every day | ORAL | Status: DC

## 2016-03-09 NOTE — Progress Notes (Signed)
MEDICARE ANNUAL WELLNESS VISIT AND FOLLOW UP Assessment:    1. Anxiety state  - LORazepam (ATIVAN) 2 MG tablet; Take 1 tablet (2 mg total) by mouth at bedtime.  Dispense: 90 tablet; Refill: 1  2. Essential hypertension -well controlled  -cont meds -monitor at home -dash diet - TSH  3. ASHD/CABG -followed by cards  4. Aortic stenosis -echo -followed by Cards  5. Gastroesophageal reflux disease, esophagitis presence not specified -cont meds -avoid trigger foods  6. CKD stage 3 due to type 2 diabetes mellitus (HCC)  - Iron and TIBC - Ferritin - Urinalysis, Routine w reflex microscopic (not at [facility]) - Parathyroid Hormone, Intact w/Ca - Phosphorus - Creatinine, Urine - Hemoglobin A1c - Protein / creatinine index, urine  7. Hyperlipidemia -cont statin - Lipid panel  8. Vitamin D deficiency -cont Vit D supplement - VITAMIN D 25 Hydroxy (Vit-D Deficiency, Fractures)  9. Medication management  - Comprehensive metabolic panel - CBC with Differential/Platelet  10. Macular degeneration -followed by Dr. Renford Dills  11. Insomnia -ativan to be called in  12. Arthralgia of shoulder, unspecified laterality  - dexamethasone (DECADRON) injection 10 mg; Inject 1 mL (10 mg total) into the muscle once.    Over 30 minutes of exam, counseling, chart review, and critical decision making was performed  Future Appointments Date Time Provider [place]  05/26/2016 2:00 PM Fay Records, MD [id number]  06/13/2016 10:30 AM Unk Pinto, MD [facility] None  01/09/2017 10:00 AM Unk Pinto, MD [facility] None     Plan:   During the course of the visit the patient was educated and counseled about appropriate screening and preventive services including:    Pneumococcal vaccine   Influenza vaccine  Prevnar 13  Td vaccine  Screening electrocardiogram  Colorectal cancer screening  Diabetes screening  Glaucoma screening  Nutrition  counseling    Subjective:  Ricky Morales is a 81 y.o. male who presents for Medicare Annual Wellness Visit and 3 month follow up for HTN, hyperlipidemia, prediabetes, and vitamin D Def.   His blood pressure has been controlled at home, today their BP is BP: 130/80 mmHg He does workout. He denies chest pain, shortness of breath, dizziness.   He reports that he is doing a lot of yard work and is also doing some carpentry work.    He is on cholesterol medication and denies myalgias. His cholesterol is at goal. The cholesterol last visit was:   Lab Results  Component Value Date   CHOL 126 11/30/2015   HDL 31* 11/30/2015   LDLCALC 66 11/30/2015   TRIG 144 11/30/2015   CHOLHDL 4.1 11/30/2015    Lab Results  Component Value Date   HGBA1C 8.7* 11/30/2015  He reports that his blood sugars have improved a lot.  He was taking prednisone for a cough.  He reports that on average he has been running around 120.    Last GFR Lab Results  Component Value Date   GFRNONAA 44* 11/30/2015     Lab Results  Component Value Date   GFRAA 51* 11/30/2015   Patient is on Vitamin D supplement.   Lab Results  Component Value Date   VD25OH 98 11/30/2015     He is unhappy with Dr. Harrington Challenger and would like to see a different cardiologist.  He reports that he has to wait a long time to get appointments.    He would like a refill for his ativan so that he can sleep at nighttime.  He does report that his shoulder is better after last trigger point injection done by Dr. Melford Aase.  He is requesting another today.  He does want to make sure his ears are clear also.     Medication Review: Current Outpatient Prescriptions on File Prior to Visit  Medication Sig Dispense Refill  . aspirin EC 81 MG tablet Take 81 mg by mouth daily.    Marland Kitchen atorvastatin (LIPITOR) 80 MG tablet Take 1 tablet by mouth  daily for cholesterol 90 tablet 1  . carvedilol (COREG) 25 MG tablet Take 1 tablet by mouth two  times daily or as  directed 180 tablet 1  . Cholecalciferol (VITAMIN D PO) Take 1,500 Units by mouth daily.     . clopidogrel (PLAVIX) 75 MG tablet Take 1 tablet by mouth  daily 90 tablet 1  . furosemide (LASIX) 20 MG tablet TAKE ONE TABLET EACH DAY  AND AN EXTRA TABLET DAILY  AS NEEDED IF WEIGHT  INCREASES 2 LBS IN 24 HOURS OR 5 LBS IN A WEEK 180 tablet 1  . glipiZIDE (GLUCOTROL) 5 MG tablet Take 1 tablet by mouth 3  times daily 270 tablet 1  . glucose blood (ACCU-CHEK SMARTVIEW) test strip Use as directed 300 each 2  . Magnesium 250 MG TABS Take 250 mg by mouth 2 (two) times daily.    . metFORMIN (GLUCOPHAGE) 500 MG tablet Take 1 tablet by mouth two  times daily with meals 180 tablet 1  . Multiple Vitamin (MULTIVITAMIN WITH MINERALS) TABS tablet Take 1 tablet by mouth daily.    Marland Kitchen NITROSTAT 0.4 MG SL tablet ONE TABLET UNDER TONGUE EVERY 5 MINUTES AS NEEDED FOR CHEST PAIN 100 tablet 1  . Omega-3 Fatty Acids (FISH OIL) 1000 MG CAPS Take 1,000 mg by mouth daily.     Marland Kitchen omeprazole (PRILOSEC) 40 MG capsule Take 40 mg by mouth daily as needed (acid reflux).     No current facility-administered medications on file prior to visit.    Allergies: Allergies  Allergen Reactions  . Actos [Pioglitazone] Other (See Comments)    Excessive weight gain  . Percocet [Oxycodone-Acetaminophen] Other (See Comments)    hallucinations    Current Problems (verified) has CKD stage 3 due to type 2 diabetes mellitus ([place]); Hyperlipidemia; Hypertension; Vitamin D deficiency; GERD (gastroesophageal reflux disease); ASHD/CABG; Medication management; Aortic stenosis; Macular degeneration; and Insomnia on his problem list.  Screening Tests Immunization History  Administered Date(s) Administered  . DT 01/30/2005, 11/11/2014  . Influenza, High Dose Seasonal PF 08/05/2014, 05/26/2015  . Influenza-Unspecified 05/10/2013  . PPD Test 09/13/2011  . Pneumococcal Conjugate-13 08/05/2014  . Pneumococcal-Unspecified 09/08/2010     Preventative care: Last colonoscopy: 2009  Prior vaccinations: TD or Tdap: 2016  Influenza: 2016 Pneumococcal: 2013 Prevnar13: 2012 Shingles/Zostavax: 2016  Names of Other Physician/Practitioners you currently use: 1. [facility] here for primary care 2. Dr. Renford Dills, eye doctor, last visit 2017 3. Does not see one currently, dentist, last visit 2015 Patient Care Team: Unk Pinto, MD as PCP - General (Internal Medicine) Fay Records, MD as Consulting Physician (Cardiology) Donato Heinz, MD as Consulting Physician (Nephrology)  Surgical: He  has past surgical history that includes Appendectomy (1955); Coronary artery bypass graft (2003); Cardiac catheterization (1991, 1992, 2003); Eye surgery (12/2006); and left and right heart catheterization with coronary/graft angiogram (N/A, 01/21/2014). Family His family history includes Cancer in his father; Diabetes in his mother; Heart disease in his mother. There is no history  of Heart attack or Stroke. Social history  He reports that he quit smoking about 47 years ago. His smoking use included Cigarettes. He quit after 4 years of use. He does not have any smokeless tobacco history on file. He reports that he drinks alcohol. His drug history is not on file.  MEDICARE WELLNESS OBJECTIVES: Physical activity:   Cardiac risk factors:   Depression/mood screen:   Depression screen [facility] 2/9 03/09/2016  Decreased Interest 0  Down, Depressed, Hopeless 0  PHQ - 2 Score 0    ADLs:  In your present state of health, do you have any difficulty performing the following activities: 11/30/2015 08/26/2015  Hearing? N N  Vision? N N  Difficulty concentrating or making decisions? N N  Walking or climbing stairs? N -  Dressing or bathing? N N  Doing errands, shopping? N N     Cognitive Testing  Alert? Yes  Normal Appearance?Yes  Oriented to person? Yes  Place? Yes   Time? Yes  Recall of three objects?   Yes  Can perform simple calculations? Yes  Displays appropriate judgment?Yes  Can read the correct time from a watch face?Yes  EOL planning: Does patient have an advance directive?: Yes Type of Advance Directive: Healthcare Power of Attorney, Living will Does patient want to make changes to advanced directive?: No - Patient declined Copy of advanced directive(s) in chart?: Yes   Objective:   Today's Vitals   03/09/16 1019  BP: 130/80  Pulse: 65  Temp: 97.7 F (36.5 C)  TempSrc: Temporal  Resp: 16  Height: 5\' 10"  (1.778 m)  Weight: 201 lb 6.4 oz (91.354 kg)  SpO2: 96%   Body mass index is 28.9 kg/(m^2).  General appearance: alert, no distress, WD/WN, male HEENT: normocephalic, sclerae anicteric, TMs pearly, nares patent, no discharge or erythema, pharynx normal Oral cavity: MMM, no lesions Neck: supple, no lymphadenopathy, no thyromegaly, no masses Heart: RRR, normal S1, S2, no murmurs Lungs: CTA bilaterally, no wheezes, rhonchi, or rales Abdomen: +bs, soft, non tender, non distended, no masses, no hepatomegaly, no splenomegaly Musculoskeletal: nontender, no swelling, no obvious deformity Extremities: no edema, no cyanosis, no clubbing Pulses: 2+ symmetric, upper and lower extremities, normal cap refill Neurological: alert, oriented x 3, CN2-12 intact, strength normal upper extremities and lower extremities, sensation normal throughout, DTRs 2+ throughout, no cerebellar signs, gait normal Psychiatric: normal affect, behavior normal, pleasant   Medicare Attestation I have personally reviewed: The patient's medical and social history Their use of alcohol, tobacco or illicit drugs Their current medications and supplements The patient's functional ability including ADLs,fall risks, home safety risks, cognitive, and hearing and visual impairment Diet and physical activities Evidence for depression or mood disorders  The patient's weight, height, BMI, and visual acuity have  been recorded in the chart.  I have made referrals, counseling, and provided education to the patient based on review of the above and I have provided the patient with a written personalized care plan for preventive services.     Starlyn Skeans, PA-C   03/09/2016

## 2016-03-10 LAB — PTH, INTACT AND CALCIUM
CALCIUM: 9.5 mg/dL (ref 8.4–10.5)
PTH: 21 pg/mL (ref 14–64)

## 2016-03-10 LAB — VITAMIN D 25 HYDROXY (VIT D DEFICIENCY, FRACTURES): Vit D, 25-Hydroxy: 61 ng/mL (ref 30–100)

## 2016-03-10 LAB — FERRITIN: Ferritin: 95 ng/mL (ref 20–380)

## 2016-03-10 LAB — CREATININE, URINE, RANDOM: Creatinine, Urine: 56 mg/dL (ref 20–370)

## 2016-03-17 ENCOUNTER — Other Ambulatory Visit: Payer: Self-pay | Admitting: *Deleted

## 2016-03-17 DIAGNOSIS — F411 Generalized anxiety disorder: Secondary | ICD-10-CM

## 2016-03-17 MED ORDER — LORAZEPAM 2 MG PO TABS: 2.0000 mg | ORAL_TABLET | Freq: Every day | ORAL | Status: AC

## 2016-03-26 ENCOUNTER — Other Ambulatory Visit: Payer: Self-pay | Admitting: Internal Medicine

## 2016-04-13 DIAGNOSIS — Z961 Presence of intraocular lens: Secondary | ICD-10-CM | POA: Diagnosis not present

## 2016-04-13 DIAGNOSIS — H35373 Puckering of macula, bilateral: Secondary | ICD-10-CM | POA: Diagnosis not present

## 2016-04-13 DIAGNOSIS — E113312 Type 2 diabetes mellitus with moderate nonproliferative diabetic retinopathy with macular edema, left eye: Secondary | ICD-10-CM | POA: Diagnosis not present

## 2016-05-26 ENCOUNTER — Ambulatory Visit: Payer: Medicare Other | Admitting: Internal Medicine

## 2016-06-05 DIAGNOSIS — C44321 Squamous cell carcinoma of skin of nose: Secondary | ICD-10-CM | POA: Diagnosis not present

## 2016-06-05 DIAGNOSIS — C44619 Basal cell carcinoma of skin of left upper limb, including shoulder: Secondary | ICD-10-CM | POA: Diagnosis not present

## 2016-06-05 DIAGNOSIS — D485 Neoplasm of uncertain behavior of skin: Secondary | ICD-10-CM | POA: Diagnosis not present

## 2016-06-05 DIAGNOSIS — C44311 Basal cell carcinoma of skin of nose: Secondary | ICD-10-CM | POA: Diagnosis not present

## 2016-06-13 ENCOUNTER — Encounter: Payer: Self-pay | Admitting: Internal Medicine

## 2016-06-13 ENCOUNTER — Ambulatory Visit (INDEPENDENT_AMBULATORY_CARE_PROVIDER_SITE_OTHER): Payer: Medicare Other | Admitting: Physician Assistant

## 2016-06-13 VITALS — BP 122/80 | HR 68 | Temp 97.7°F | Resp 14 | Ht 70.0 in | Wt 207.4 lb

## 2016-06-13 DIAGNOSIS — E1122 Type 2 diabetes mellitus with diabetic chronic kidney disease: Secondary | ICD-10-CM | POA: Diagnosis not present

## 2016-06-13 DIAGNOSIS — E559 Vitamin D deficiency, unspecified: Secondary | ICD-10-CM

## 2016-06-13 DIAGNOSIS — I350 Nonrheumatic aortic (valve) stenosis: Secondary | ICD-10-CM | POA: Diagnosis not present

## 2016-06-13 DIAGNOSIS — N183 Chronic kidney disease, stage 3 (moderate): Secondary | ICD-10-CM

## 2016-06-13 DIAGNOSIS — Z79899 Other long term (current) drug therapy: Secondary | ICD-10-CM

## 2016-06-13 DIAGNOSIS — I2510 Atherosclerotic heart disease of native coronary artery without angina pectoris: Secondary | ICD-10-CM | POA: Diagnosis not present

## 2016-06-13 DIAGNOSIS — I10 Essential (primary) hypertension: Secondary | ICD-10-CM | POA: Diagnosis not present

## 2016-06-13 DIAGNOSIS — E785 Hyperlipidemia, unspecified: Secondary | ICD-10-CM | POA: Diagnosis not present

## 2016-06-13 LAB — CBC WITH DIFFERENTIAL/PLATELET
BASOS ABS: 72 {cells}/uL (ref 0–200)
Basophils Relative: 1 %
EOS ABS: 504 {cells}/uL — AB (ref 15–500)
Eosinophils Relative: 7 %
HCT: 32.3 % — ABNORMAL LOW (ref 38.5–50.0)
HEMOGLOBIN: 10.6 g/dL — AB (ref 13.2–17.1)
LYMPHS ABS: 1440 {cells}/uL (ref 850–3900)
Lymphocytes Relative: 20 %
MCH: 31.5 pg (ref 27.0–33.0)
MCHC: 32.8 g/dL (ref 32.0–36.0)
MCV: 96.1 fL (ref 80.0–100.0)
MPV: 9.2 fL (ref 7.5–12.5)
Monocytes Absolute: 432 cells/uL (ref 200–950)
Monocytes Relative: 6 %
NEUTROS ABS: 4752 {cells}/uL (ref 1500–7800)
Neutrophils Relative %: 66 %
Platelets: 284 10*3/uL (ref 140–400)
RBC: 3.36 MIL/uL — ABNORMAL LOW (ref 4.20–5.80)
RDW: 15.5 % — ABNORMAL HIGH (ref 11.0–15.0)
WBC: 7.2 10*3/uL (ref 3.8–10.8)

## 2016-06-13 LAB — URINALYSIS, ROUTINE W REFLEX MICROSCOPIC
Bilirubin Urine: NEGATIVE
Glucose, UA: NEGATIVE
Hgb urine dipstick: NEGATIVE
KETONES UR: NEGATIVE
Leukocytes, UA: NEGATIVE
NITRITE: NEGATIVE
PH: 6.5 (ref 5.0–8.0)
Protein, ur: NEGATIVE
SPECIFIC GRAVITY, URINE: 1.017 (ref 1.001–1.035)

## 2016-06-13 LAB — HEMOGLOBIN A1C
Hgb A1c MFr Bld: 8.1 % — ABNORMAL HIGH (ref <5.7)
MEAN PLASMA GLUCOSE: 186 mg/dL

## 2016-06-13 LAB — TSH: TSH: 1.55 m[IU]/L (ref 0.40–4.50)

## 2016-06-13 NOTE — Progress Notes (Signed)
Assessment and Plan:   Essential hypertension - continue medications, DASH diet, exercise and monitor at home. Call if greater than 130/80.  - CBC with Differential - BASIC METABOLIC PANEL WITH GFR - Hepatic function panel - TSH  ASHD/CABG Control blood pressure, cholesterol, glucose, increase exercise.  Continue cardio follow up, will refer to difference cardiologist  Aortic stenosis Continue cardio follow up   Vitamin D deficiency - Vit D  25 hydroxy (rtn osteoporosis monitoring)  CKD stage 3 due to type 2 diabetes mellitus Discussed general issues about diabetes pathophysiology and management., Educational material distributed., Suggested low cholesterol diet., Encouraged aerobic exercise., Discussed foot care., Reminded to get yearly retinal exam. - Hemoglobin A1c - HM DIABETES FOOT EXAM  Hyperlipidemia -continue medications, check lipids, decrease fatty foods, increase activity. - Lipid panel  Nonrheumatic aortic valve stenosis - monitor weight, take lasix PRN -     Ambulatory referral to Cardiology   Continue diet and meds as discussed. Further disposition pending results of labs. Over 30 minutes of exam, counseling, chart review, and critical decision making was performed  HPI 81 y.o. male  presents for 3 month follow up on hypertension, cholesterol, prediabetes, and vitamin D deficiency.   His blood pressure has been controlled at home, today their BP is BP: 122/80  He does not workout. He denies chest pain,  Dizziness. Has DOE/fatigue with exertion but this is unchanged.  He has a history of CAD due to DM, had PTCA in 1991 and a CABG in 2003. He has IDCMP as well with EF 55% after medical optimization, follows with cardio, Dr. Harrington Challenger however he has to wait long times for appts and would like a new physician. Weight is stable.  His nephrologist stopped the enalapril.   He is on cholesterol medication and denies myalgias. His cholesterol is at goal. The cholesterol last  visit was:   Lab Results  Component Value Date   CHOL 122 (L) 03/09/2016   HDL 34 (L) 03/09/2016   LDLCALC 58 03/09/2016   TRIG 148 03/09/2016   CHOLHDL 3.6 03/09/2016    He has been working on diet and exercise for diabetes with CKD and CAD, sugar in the AM will be 130-140, he is on bASA/plavix, he is not on ACE/ARB due to kidney function, he is on glipizide twice a day, occ he will take a 3rd during the day, no hypoglycemia, admits to eating white bread, and denies polydipsia, polyuria and visual disturbances. Last A1C in the office was:  Lab Results  Component Value Date   HGBA1C 7.8 (H) 03/09/2016    He follows with nephrology.  Lab Results  Component Value Date   GFRNONAA 44 (L) 11/30/2015   Patient is on Vitamin D supplement.   Lab Results  Component Value Date   VD25OH 61 03/09/2016     BMI is Body mass index is 29.76 kg/m., he is working on diet and exercise. Wt Readings from Last 3 Encounters:  06/13/16 207 lb 6.4 oz (94.1 kg)  03/09/16 201 lb 6.4 oz (91.4 kg)  11/30/15 201 lb 9.6 oz (91.4 kg)    Current Medications:  Current Outpatient Prescriptions on File Prior to Visit  Medication Sig Dispense Refill  . aspirin EC 81 MG tablet Take 81 mg by mouth daily.    Marland Kitchen atorvastatin (LIPITOR) 80 MG tablet Take 1 tablet by mouth  daily for cholesterol 90 tablet 1  . Blood Glucose Monitoring Suppl (ACCU-CHEK NANO SMARTVIEW) w/Device KIT Use as directed 1  kit 0  . carvedilol (COREG) 25 MG tablet Take 1 tablet by mouth two  times daily or as directed 180 tablet 1  . Cholecalciferol (VITAMIN D PO) Take 1,500 Units by mouth daily.     . clopidogrel (PLAVIX) 75 MG tablet Take 1 tablet by mouth  daily 90 tablet 1  . furosemide (LASIX) 20 MG tablet TAKE ONE TABLET EACH DAY  AND AN EXTRA TABLET DAILY  AS NEEDED IF WEIGHT  INCREASES 2 LBS IN 24 HOURS OR 5 LBS IN A WEEK 180 tablet 1  . glipiZIDE (GLUCOTROL) 5 MG tablet Take 1 tablet by mouth 3  times daily 270 tablet 1  . glucose  blood (ACCU-CHEK SMARTVIEW) test strip Use as directed 300 each 2  . LORazepam (ATIVAN) 2 MG tablet Take 1 tablet (2 mg total) by mouth at bedtime. 90 tablet 1  . Magnesium 250 MG TABS Take 250 mg by mouth 2 (two) times daily.    . metFORMIN (GLUCOPHAGE) 500 MG tablet Take 1 tablet by mouth two  times daily with meals 180 tablet 1  . Multiple Vitamin (MULTIVITAMIN WITH MINERALS) TABS tablet Take 1 tablet by mouth daily.    Marland Kitchen NITROSTAT 0.4 MG SL tablet ONE TABLET UNDER TONGUE EVERY 5 MINUTES AS NEEDED FOR CHEST PAIN 100 tablet 1  . Omega-3 Fatty Acids (FISH OIL) 1000 MG CAPS Take 1,000 mg by mouth daily.     Marland Kitchen omeprazole (PRILOSEC) 40 MG capsule Take 40 mg by mouth daily as needed (acid reflux).     No current facility-administered medications on file prior to visit.    Medical History:  Past Medical History:  Diagnosis Date  . Diabetes mellitus without complication ([place])   . GERD (gastroesophageal reflux disease)   . Hyperlipidemia   . Hypertension   . Type II or unspecified type diabetes mellitus with renal manifestations, not stated as uncontrolled(250.40)   . Vitamin D deficiency    Allergies:  Allergies  Allergen Reactions  . Actos [Pioglitazone] Other (See Comments)    Excessive weight gain  . Percocet [Oxycodone-Acetaminophen] Other (See Comments)    hallucinations     Review of Systems:  Review of Systems  Constitutional: Positive for malaise/fatigue. Negative for chills, diaphoresis, fever and weight loss.  HENT: Negative.   Eyes: Negative.   Respiratory: Positive for shortness of breath. Negative for cough, hemoptysis, sputum production and wheezing.   Cardiovascular: Negative for chest pain, palpitations, orthopnea, claudication, leg swelling and PND.  Gastrointestinal: Negative.   Genitourinary: Negative.   Musculoskeletal: Positive for back pain and joint pain. Negative for falls, myalgias and neck pain.  Skin: Negative.   Neurological: Negative.  Negative for  weakness.  Psychiatric/Behavioral: Negative.     Family history- Review and unchanged Social history- Review and unchanged Physical Exam: BP 122/80   Pulse 68   Temp 97.7 F (36.5 C)   Resp 14   Ht '5\' 10"'$  (1.778 m)   Wt 207 lb 6.4 oz (94.1 kg)   SpO2 99%   BMI 29.76 kg/m  Wt Readings from Last 3 Encounters:  06/13/16 207 lb 6.4 oz (94.1 kg)  03/09/16 201 lb 6.4 oz (91.4 kg)  11/30/15 201 lb 9.6 oz (91.4 kg)   HEENT: normocephalic, sclerae anicteric, bilateral cerumen impaction nares patent, no discharge or erythema, pharynx normal Oral cavity: MMM, no lesions Neck: supple, no lymphadenopathy, no thyromegaly, no masses Heart: RRR, normal S1, S2, 3/6 systolic murmur best at LSB Lungs: CTA bilaterally, no wheezes,  rhonchi, or rales Abdomen: +bs, soft, non tender, non distended, no masses, no hepatomegaly, no splenomegaly Musculoskeletal: nontender, no swelling, no obvious deformity Extremities: 1-2+ edema, no cyanosis, no clubbing Pulses: 2+ symmetric, upper and lower extremities, normal cap refill Neurological: alert, oriented x 3, CN2-12 intact, strength normal upper extremities and lower extremities, sensation normal throughout, DTRs 2+ throughout, no cerebellar signs, gait normal Psychiatric: normal affect, behavior normal, pleasant    Vicie Mutters, PA-C 10:53 AM [facility]

## 2016-06-13 NOTE — Patient Instructions (Signed)
    Bad carbs also include fruit juice, alcohol, and sweet tea. These are empty calories that do not signal to your brain that you are full.   Please remember the good carbs are still carbs which convert into sugar. So please measure them out no more than 1/2-1 cup of rice, oatmeal, pasta, and beans  Veggies are however free foods! Pile them on.   Not all fruit is created equal. Please see the list below, the fruit at the bottom is higher in sugars than the fruit at the top. Please avoid all dried fruits.     Recommendations For Diabetic/Prediabetic Patients:   -  Take medications as prescribed  -  Recommend Dr Joel Fuhrman's book "The End of Diabetes "  And "The End of Dieting"- Can get at  www.[employer].com and encourage also get the Audio CD book  - AVOID Animal products, ie. Meat - red/white, Poultry and Dairy/especially cheese - Exercise at least 5 times a week for 30 minutes or preferably daily.  - No Smoking - Drink less than 2 drinks a day.  - Monitor your feet for sores - Have yearly Eye Exams - Recommend annual Flu vaccine  - Recommend Pneumovax and Prevnar vaccines - Shingles Vaccine (Zostavax) if over 60 y.o.  Goals:   - BMI less than 24 - Fasting sugar less than 130 or less than 150 if tapering medicines to lose weight  - Systolic BP less than 130  - Diastolic BP less than 80 - Bad LDL Cholesterol less than 70 - Triglycerides less than 150  

## 2016-06-14 LAB — HEPATIC FUNCTION PANEL
ALBUMIN: 3.8 g/dL (ref 3.6–5.1)
ALK PHOS: 52 U/L (ref 40–115)
ALT: 35 U/L (ref 9–46)
AST: 27 U/L (ref 10–35)
BILIRUBIN DIRECT: 0.2 mg/dL (ref <=0.2)
Indirect Bilirubin: 0.7 mg/dL (ref 0.2–1.2)
Total Bilirubin: 0.9 mg/dL (ref 0.2–1.2)
Total Protein: 7 g/dL (ref 6.1–8.1)

## 2016-06-14 LAB — LIPID PANEL
CHOL/HDL RATIO: 3.7 ratio (ref <=5.0)
Cholesterol: 110 mg/dL — ABNORMAL LOW (ref 125–200)
HDL: 30 mg/dL — ABNORMAL LOW (ref >=40)
LDL CALC: 60 mg/dL (ref <130)
Triglycerides: 102 mg/dL (ref <150)
VLDL: 20 mg/dL (ref <30)

## 2016-06-14 LAB — MICROALBUMIN / CREATININE URINE RATIO
Creatinine, Urine: 139 mg/dL (ref 20–370)
MICROALB UR: 3.5 mg/dL
MICROALB/CREAT RATIO: 25 ug/mg{creat} (ref <30)

## 2016-06-14 LAB — BASIC METABOLIC PANEL WITH GFR
BUN: 33 mg/dL — AB (ref 7–25)
CHLORIDE: 99 mmol/L (ref 98–110)
CO2: 21 mmol/L (ref 20–31)
CREATININE: 1.56 mg/dL — AB (ref 0.70–1.18)
Calcium: 9.9 mg/dL (ref 8.6–10.3)
GFR, Est African American: 48 mL/min — ABNORMAL LOW (ref >=60)
GFR, Est Non African American: 42 mL/min — ABNORMAL LOW (ref >=60)
Glucose, Bld: 179 mg/dL — ABNORMAL HIGH (ref 65–99)
POTASSIUM: 4.4 mmol/L (ref 3.5–5.3)
Sodium: 138 mmol/L (ref 135–146)

## 2016-06-14 LAB — MAGNESIUM: Magnesium: 1.7 mg/dL (ref 1.5–2.5)

## 2016-06-14 LAB — VITAMIN D 25 HYDROXY (VIT D DEFICIENCY, FRACTURES): Vit D, 25-Hydroxy: 61 ng/mL (ref 30–100)

## 2016-06-28 ENCOUNTER — Encounter: Payer: Self-pay | Admitting: *Deleted

## 2016-06-29 DIAGNOSIS — E113313 Type 2 diabetes mellitus with moderate nonproliferative diabetic retinopathy with macular edema, bilateral: Secondary | ICD-10-CM | POA: Diagnosis not present

## 2016-06-29 DIAGNOSIS — E11311 Type 2 diabetes mellitus with unspecified diabetic retinopathy with macular edema: Secondary | ICD-10-CM | POA: Diagnosis not present

## 2016-06-29 DIAGNOSIS — Z961 Presence of intraocular lens: Secondary | ICD-10-CM | POA: Diagnosis not present

## 2016-06-29 DIAGNOSIS — H35373 Puckering of macula, bilateral: Secondary | ICD-10-CM | POA: Diagnosis not present

## 2016-07-04 ENCOUNTER — Encounter: Payer: Self-pay | Admitting: Internal Medicine

## 2016-07-04 ENCOUNTER — Ambulatory Visit (INDEPENDENT_AMBULATORY_CARE_PROVIDER_SITE_OTHER): Payer: Medicare Other | Admitting: Internal Medicine

## 2016-07-04 VITALS — BP 126/76 | HR 80 | Temp 98.6°F | Resp 18 | Ht 70.0 in

## 2016-07-04 DIAGNOSIS — J069 Acute upper respiratory infection, unspecified: Secondary | ICD-10-CM

## 2016-07-04 MED ORDER — ALBUTEROL SULFATE HFA 108 (90 BASE) MCG/ACT IN AERS: 2.0000 | INHALATION_SPRAY | RESPIRATORY_TRACT | 0 refills | Status: AC | PRN

## 2016-07-04 MED ORDER — AZITHROMYCIN 250 MG PO TABS: ORAL_TABLET | ORAL | 0 refills | Status: AC

## 2016-07-04 MED ORDER — BUDESONIDE-FORMOTEROL FUMARATE 80-4.5 MCG/ACT IN AERO: 2.0000 | INHALATION_SPRAY | Freq: Two times a day (BID) | RESPIRATORY_TRACT | 0 refills | Status: AC

## 2016-07-04 MED ORDER — PROMETHAZINE-DM 6.25-15 MG/5ML PO SYRP: ORAL_SOLUTION | ORAL | 1 refills | Status: AC

## 2016-07-04 MED ORDER — FLUTICASONE PROPIONATE 50 MCG/ACT NA SUSP: 2.0000 | Freq: Every day | NASAL | 0 refills | Status: AC

## 2016-07-04 NOTE — Patient Instructions (Signed)
Please use the symbicort inhaler with the spacer 2 puffs in the morning and 2 puffs in the evening.  Please use the albuterol 2 puffs every six hours only if you need it for shortness of breath, wheezing or severe coughing.  Please use the zpak daily until it is gone.  Please take cough syrup up to 4 times per day as needed for coughing.  Please use xyzal daily.  Please use 2 sprays of flonase in each nostril right before you go to bed at nighttime.    Please drink plenty of water  Try to rinse your nose out with nasal saline.

## 2016-07-04 NOTE — Progress Notes (Signed)
HPI  Patient presents to the office for evaluation of cough.  It has been going on for 4 days.  Patient reports all the time, wet, worse with lying down, does respond mildly to cough drops.  They also endorse change in voice, chills, postnasal drip, shortness of breath, wheezing and headache and mild sore throat. .  They have tried cough drops, tussin pm, nyquil.  They report that nothing has worked.  They denies other sick contacts.    Review of Systems  Constitutional: Positive for malaise/fatigue. Negative for chills and fever.  HENT: Positive for congestion, ear pain, hearing loss and sore throat.   Respiratory: Positive for cough. Negative for sputum production, shortness of breath and wheezing.   Cardiovascular: Negative for chest pain, palpitations and leg swelling.  Neurological: Positive for headaches.    PE:  Vitals:   07/04/16 1013  BP: 126/76  Pulse: 80  Resp: 18  Temp: 98.6 F (37 C)    General:  Alert and non-toxic, WDWN, NAD HEENT: NCAT, PERLA, EOM normal, no occular discharge or erythema.  Nasal mucosal edema with sinus tenderness to palpation.  Oropharynx clear with minimal oropharyngeal edema and erythema.  Mucous membranes moist and pink. Neck:  Cervical adenopathy Chest:  RRR no MRGs.  Lungs clear to auscultation A&P with no wheezes rhonchi or rales.   Abdomen: +BS x 4 quadrants, soft, non-tender, no guarding, rigidity, or rebound. Skin: warm and dry no rash Neuro: A&Ox4, CN II-XII grossly intact  Assessment and Plan:  -zpak -phenergan dm -symbicort 80 BID sample given here -albuterol q6 hrs prn -avoid prednisone due to poorly controlled blood sugars -flonase -xyzal -benadryl qhs at bedtime prn

## 2016-07-11 ENCOUNTER — Other Ambulatory Visit: Payer: Self-pay | Admitting: Internal Medicine

## 2016-07-13 ENCOUNTER — Ambulatory Visit: Payer: Medicare Other | Admitting: Internal Medicine

## 2016-07-15 ENCOUNTER — Other Ambulatory Visit: Payer: Self-pay | Admitting: Internal Medicine

## 2016-07-28 DEATH — deceased

## 2016-09-28 ENCOUNTER — Ambulatory Visit: Payer: Self-pay | Admitting: Internal Medicine

## 2017-01-09 ENCOUNTER — Encounter: Payer: Self-pay | Admitting: Internal Medicine
